# Patient Record
Sex: Male | Born: 1990 | Race: White | Hispanic: No | Marital: Single | Smoking: Former smoker
Health system: Southern US, Community
[De-identification: ages and names within clinical notes are randomized; demographics above are authoritative.]

## PROBLEM LIST (undated history)

## (undated) HISTORY — PX: NO PAST SURGERIES: SHX2092

---

## 2015-03-09 ENCOUNTER — Ambulatory Visit
Admission: EM | Admit: 2015-03-09 | Discharge: 2015-03-09 | Disposition: A | Discharge status: Home / Self Care | Payer: Self-pay | Attending: Family Medicine | Admitting: Family Medicine

## 2015-03-09 ENCOUNTER — Encounter: Payer: Self-pay | Admitting: Emergency Medicine

## 2015-03-09 DIAGNOSIS — N342 Other urethritis: Secondary | ICD-10-CM | POA: Insufficient documentation

## 2015-03-09 LAB — URINALYSIS COMPLETE WITH MICROSCOPIC (ARMC ONLY)
BILIRUBIN URINE: NEGATIVE
Bacteria, UA: NONE SEEN — AB
Glucose, UA: NEGATIVE mg/dL
Ketones, ur: NEGATIVE mg/dL
NITRITE: NEGATIVE
SPECIFIC GRAVITY, URINE: 1.025 (ref 1.005–1.030)
Squamous Epithelial / LPF: NONE SEEN — AB
pH: 6 (ref 5.0–8.0)

## 2015-03-09 MED ORDER — CEFTRIAXONE SODIUM 250 MG IJ SOLR
250.0000 mg | Freq: Once | INTRAMUSCULAR | Status: AC
  Administered 2015-03-09: 250 mg via INTRAMUSCULAR

## 2015-03-09 MED ORDER — AZITHROMYCIN 500 MG PO TABS
1000.0000 mg | ORAL_TABLET | Freq: Once | ORAL | Status: AC
  Administered 2015-03-09: 1000 mg via ORAL

## 2015-03-09 NOTE — ED Provider Notes (Signed)
[facility] Emergency Department Provider Note  ____________________________________________  Time seen: Approximately 10:21 AM  I have reviewed the triage vital signs and the nursing notes.   HISTORY  Chief Complaint Dysuria and Penile Discharge    HPI Jimmy Hale is a 24 y.o. male presents with a one-week history of burning upon urination and penile discharge. States sexually active with unprotected intercourse. Also states that is having pain within the penis itself but denies any testicular pain.   History reviewed. No pertinent past medical history.  There are no active problems to display for this patient.   History reviewed. No pertinent past surgical history.  No current outpatient prescriptions on file.  Allergies Review of patient's allergies indicates no known allergies.  History reviewed. No pertinent family history.  Social History History  Substance Use Topics  . Smoking status: Never Smoker   . Smokeless tobacco: Never Used  . Alcohol Use: Yes    Review of Systems Constitutional: No fever/chills Eyes: No visual changes. ENT: No sore throat. Cardiovascular: Denies chest pain. Respiratory: Denies shortness of breath. Gastrointestinal: No abdominal pain.  No nausea, no vomiting.  No diarrhea.  No constipation. Genitourinary: Positive for dysuria and off whitish colored discharge. Musculoskeletal: Negative for back pain. Skin: Negative for rash. Neurological: Negative for headaches, focal weakness or numbness.  10-point ROS otherwise negative.  ____________________________________________   PHYSICAL EXAM:  VITAL SIGNS: ED Triage Vitals  Enc Vitals Group     BP 03/09/15 1003 117/73 mmHg     Pulse Rate 03/09/15 1003 77     Resp 03/09/15 1003 16     Temp 03/09/15 1003 97.5 F (36.4 C)     Temp Source 03/09/15 1003 Oral     SpO2 03/09/15 1003 98 %     Weight 03/09/15 1003 190 lb (86.183 kg)     Height 03/09/15  1003  (1.905 m)     Head Cir --      Peak Flow --      Pain Score 03/09/15 1005 4     Pain Loc --      Pain Edu? --      Excl. in GC? --     Constitutional: Alert and oriented. Well appearing and in no acute distress. Eyes: Conjunctivae are normal. PERRL. EOMI. Head: Atraumatic. Nose: No congestion/rhinnorhea. Mouth/Throat: Mucous membranes are moist.  Oropharynx non-erythematous. Neck: No stridor.   Cardiovascular: Normal rate, regular rhythm. Grossly normal heart sounds.  Good peripheral circulation. Respiratory: Normal respiratory effort.  No retractions. Lungs CTAB. Gastrointestinal: Soft and nontender. No distention. No abdominal bruits. No CVA tenderness. Genitourinary: Whitish discharge noted. No testicular tenderness. Musculoskeletal: No lower extremity tenderness nor edema.  No joint effusions. Neurologic:  Normal speech and language. No gross focal neurologic deficits are appreciated. Speech is normal. No gait instability. Skin:  Skin is warm, dry and intact. No rash noted. Psychiatric: Mood and affect are normal. Speech and behavior are normal.  ____________________________________________   LABS (all labs ordered are listed, but only abnormal results are displayed)  Labs Reviewed  URINALYSIS COMPLETEWITH MICROSCOPIC ([facility] ONLY) - Abnormal; Notable for the following:    APPearance CLOUDY (*)    Hgb urine dipstick TRACE (*)    Protein, ur TRACE (*)    Leukocytes, UA 3+ (*)    Bacteria, UA NONE SEEN (*)    Squamous Epithelial / LPF NONE SEEN (*)    All other components within normal limits  CHLAMYDIA/NGC RT PCR ([facility] ONLY)  ____________________________________________  EKG  Deferred ____________________________________________  RADIOLOGY  Deferred ____________________________________________   PROCEDURES  Procedure(s) performed: None  Critical Care performed: No  ____________________________________________   INITIAL IMPRESSION /  ASSESSMENT AND PLAN / ED COURSE  Pertinent labs & imaging results that were available during my care of the patient were reviewed by me and considered in my medical decision making (see chart for details).  Treat for acute urethritis rule out gonorrhea and chlamydia. Rx provided in-house for Zithromax 1 g, and Rocephin 250 IM. Patient instructed notify all sexual partners. Await final GC chlamydia results. Discharged to home to return with any worsening symptomology. He voices no other emergency medical complaints at this visit. ____________________________________________   FINAL CLINICAL IMPRESSION(S) / ED DIAGNOSES  Final diagnoses:  Urethritis      Evangeline Dakin, PA-C 03/09/15 1127

## 2015-03-09 NOTE — Discharge Instructions (Signed)
Urethritis °Urethritis is an inflammation of the tube through which urine exits your bladder (urethra).  °CAUSES °Urethritis is often caused by an infection in your urethra. The infection can be viral, like herpes. The infection can also be bacterial, like gonorrhea. °RISK FACTORS °Risk factors of urethritis include: °· Having sex without using a condom. °· Having multiple sexual partners. °· Having poor hygiene. °SIGNS AND SYMPTOMS °Symptoms of urethritis are less noticeable in women than in men. These symptoms include: °· Burning feeling when you urinate (dysuria). °· Discharge from your urethra. °· Blood in your urine (hematuria). °· Urinating more than usual. °DIAGNOSIS  °To confirm a diagnosis of urethritis, your health care provider will do the following: °· Ask about your sexual history. °· Perform a physical exam. °· Have you provide a sample of your urine for lab testing. °· Use a cotton swab to gently collect a sample from your urethra for lab testing. °TREATMENT  °It is important to treat urethritis. Depending on the cause, untreated urethritis may lead to serious genital infections and possibly infertility. Urethritis caused by a bacterial infection is treated with antibiotic medicine. All sexual partners must be treated.  °HOME CARE INSTRUCTIONS °· Do not have sex until the test results are known and treatment is completed, even if your symptoms go away before you finish treatment. °· If you were prescribed an antibiotic, finish it all even if you start to feel better. °SEEK MEDICAL CARE IF:  °· Your symptoms are not improved in 3 days. °· Your symptoms are getting worse. °· You develop abdominal pain or pelvic pain (in women). °· You develop joint pain. °· You have a fever. °SEEK IMMEDIATE MEDICAL CARE IF:  °· You have severe pain in the belly, back, or side. °· You have repeated vomiting. °MAKE SURE YOU: °· Understand these instructions. °· Will watch your condition. °· Will get help right away if you  are not doing well or get worse. °Document Released: 03/12/2001 Document Revised: 01/31/2014 Document Reviewed: 05/17/2013 °[employer]® Patient Information ©2015 [employer]. This information is not intended to replace advice given to you by your health care provider. Make sure you discuss any questions you have with your health care provider. ° °

## 2015-03-09 NOTE — ED Notes (Signed)
Patient c/o burning when urinating and penile discharge for a week.  Patient denies fevers.  Patient denies N/V.

## 2015-03-10 LAB — CHLAMYDIA/NGC RT PCR (ARMC ONLY)
Chlamydia Tr: NOT DETECTED
N GONORRHOEAE: DETECTED

## 2020-08-21 ENCOUNTER — Other Ambulatory Visit: Payer: Self-pay

## 2020-08-21 ENCOUNTER — Encounter: Payer: Self-pay | Admitting: Emergency Medicine

## 2020-08-21 ENCOUNTER — Ambulatory Visit
Admission: EM | Admit: 2020-08-21 | Discharge: 2020-08-21 | Disposition: A | Discharge status: Home / Self Care | Payer: Self-pay | Attending: Physician Assistant | Admitting: Physician Assistant

## 2020-08-21 ENCOUNTER — Ambulatory Visit (INDEPENDENT_AMBULATORY_CARE_PROVIDER_SITE_OTHER): Payer: Self-pay

## 2020-08-21 DIAGNOSIS — R059 Cough, unspecified: Secondary | ICD-10-CM

## 2020-08-21 DIAGNOSIS — R0602 Shortness of breath: Secondary | ICD-10-CM

## 2020-08-21 DIAGNOSIS — J209 Acute bronchitis, unspecified: Secondary | ICD-10-CM

## 2020-08-21 MED ORDER — ALBUTEROL SULFATE HFA 108 (90 BASE) MCG/ACT IN AERS: 1.0000 | INHALATION_SPRAY | RESPIRATORY_TRACT | 0 refills | Status: AC | PRN

## 2020-08-21 MED ORDER — PREDNISONE 20 MG PO TABS: 40.0000 mg | ORAL_TABLET | Freq: Every day | ORAL | 0 refills | Status: AC

## 2020-08-21 MED ORDER — AZITHROMYCIN 250 MG PO TABS: 250.0000 mg | ORAL_TABLET | Freq: Every day | ORAL | 0 refills | Status: DC

## 2020-08-21 NOTE — ED Provider Notes (Signed)
[facility]    CSN: [id number] Arrival date & time: 08/21/20  1515      History   Chief Complaint Chief Complaint  Patient presents with  . Cough    HPI Jimmy Hale is a 29 y.o. male presenting for 3 week history of chest congestion and  pressure, productive cough, shortness of breath, and a few episodes of vomiting.  Patient states that he has noticed streaks of blood in his yellow-colored sputum over the last 2 days.  Patient states that he does dip and vape.  He says he is concerned about the blood in his sputum.  Patient denies any associated fever, fatigue, weakness, abdominal pain.  He is otherwise healthy without any chronic medical problems.  Denies any history of cardiopulmonary disease.  No known exposure to Covid.  Patient not vaccinated for Covid.  Denies any history of bleeding or clotting disorders.  No history of DVT or PE.  He has not taken any over-the-counter medication for symptoms.  No other complaints or concerns.  HPI  History reviewed. No pertinent past medical history.  There are no problems to display for this patient.   Past Surgical History:  Procedure Laterality Date  . NO PAST SURGERIES         Home Medications    Prior to Admission medications   Medication Sig Start Date End Date Taking? Authorizing Provider  albuterol (VENTOLIN HFA) 108 (90 Base) MCG/ACT inhaler Inhale 1-2 puffs into the lungs every 4 (four) hours as needed for up to 10 days for wheezing or shortness of breath. 08/21/20 08/31/20  Eusebio Friendly B, PA-C  azithromycin (ZITHROMAX) 250 MG tablet Take 1 tablet (250 mg total) by mouth daily. Take first 2 tablets together, then 1 every day until finished. 08/21/20   Shirlee Latch, PA-C  predniSONE (DELTASONE) 20 MG tablet Take 2 tablets (40 mg total) by mouth daily for 5 days. 08/21/20 08/26/20  Shirlee Latch, PA-C    Family History Family History  Problem Relation Age of Onset  . Healthy Mother   . Healthy  Father     Social History Social History   Tobacco Use  . Smoking status: Never Smoker  . Smokeless tobacco: Current User    Types: Snuff  Vaping Use  . Vaping Use: Some days  Substance Use Topics  . Alcohol use: Yes  . Drug use: No     Allergies   Patient has no known allergies.   Review of Systems Review of Systems  Constitutional: Negative for fatigue and fever.  HENT: Positive for congestion. Negative for rhinorrhea, sinus pressure, sinus pain and sore throat.   Respiratory: Positive for cough and shortness of breath. Negative for wheezing.   Cardiovascular: Positive for chest pain.  Gastrointestinal: Negative for abdominal pain, diarrhea, nausea and vomiting.  Musculoskeletal: Negative for myalgias.  Neurological: Negative for weakness, light-headedness and headaches.  Hematological: Negative for adenopathy.     Physical Exam Triage Vital Signs ED Triage Vitals  Enc Vitals Group     BP 08/21/20 1609 119/83     Pulse Rate 08/21/20 1609 79     Resp 08/21/20 1609 16     Temp 08/21/20 1609 98.1 F (36.7 C)     Temp Source 08/21/20 1609 Oral     SpO2 08/21/20 1609 100 %     Weight 08/21/20 1607 190 lb 0.6 oz (86.2 kg)     Height 08/21/20 1607 6\' 3"  (1.905 m)  Head Circumference --      Peak Flow --      Pain Score 08/21/20 1607 5     Pain Loc --      Pain Edu? --      Excl. in GC? --    No data found.  Updated Vital Signs BP 119/83 (BP Location: Right Arm)   Pulse 79   Temp 98.1 F (36.7 C) (Oral)   Resp 16   Ht 6\' 3"  (1.905 m)   Wt 190 lb 0.6 oz (86.2 kg)   SpO2 100%   BMI 23.75 kg/m       Physical Exam Vitals and nursing note reviewed.  Constitutional:      General: He is not in acute distress.    Appearance: Normal appearance. He is well-developed. He is not ill-appearing, toxic-appearing or diaphoretic.  HENT:     Head: Normocephalic and atraumatic.     Right Ear: Tympanic membrane, ear canal and external ear normal.     Left Ear:  Tympanic membrane, ear canal and external ear normal.     Nose: Rhinorrhea present.     Mouth/Throat:     Pharynx: Uvula midline. Posterior oropharyngeal erythema (with clear PND) present. No oropharyngeal exudate.     Tonsils: No tonsillar abscesses.  Eyes:     General: No scleral icterus.       Right eye: No discharge.        Left eye: No discharge.     Conjunctiva/sclera: Conjunctivae normal.     Pupils: Pupils are equal, round, and reactive to light.  Neck:     Thyroid: No thyromegaly.     Trachea: No tracheal deviation.  Cardiovascular:     Rate and Rhythm: Normal rate and regular rhythm.     Heart sounds: Normal heart sounds.  Pulmonary:     Effort: Pulmonary effort is normal. No respiratory distress.     Breath sounds: No stridor. Rhonchi (few scattered rhonchi throughout which clear with cough) present. No wheezing or rales.  Chest:     Chest wall: No tenderness.  Musculoskeletal:     Cervical back: Normal range of motion and neck supple.  Skin:    General: Skin is warm and dry.     Findings: No rash.  Neurological:     General: No focal deficit present.     Mental Status: He is alert. Mental status is at baseline.     Motor: No weakness.     Gait: Gait normal.  Psychiatric:        Mood and Affect: Mood normal.        Behavior: Behavior normal.        Thought Content: Thought content normal.      UC Treatments / Results  Labs (all labs ordered are listed, but only abnormal results are displayed) Labs Reviewed - No data to display  EKG   Radiology DG Chest 2 View  Result Date: 08/21/2020 CLINICAL DATA:  Cough, shortness of breath. EXAM: CHEST - 2 VIEW COMPARISON:  None. FINDINGS: The heart size and mediastinal contours are within normal limits. Both lungs are clear. No pneumothorax or pleural effusion is noted. The visualized skeletal structures are unremarkable. IMPRESSION: No active cardiopulmonary disease. Electronically Signed   By: 08/23/2020 M.D.    On: 08/21/2020 16:24    Procedures Procedures (including critical care time)  Medications Ordered in UC Medications - No data to display  Initial Impression / Assessment and Plan /  UC Course  I have reviewed the triage vital signs and the nursing notes.  Pertinent labs & imaging results that were available during my care of the patient were reviewed by me and considered in my medical decision making (see chart for details).   29 year old male presenting for 3-week history of productive cough, chest pressure and pain difficulty.  New onset streaks of blood in the sputum.  All vital signs are within normal limits and stable.  Patient afebrile.  Chest x-ray obtained today due to symptoms.  Chest x-ray is within normal limits.  I reviewed the x-ray personally.  Suspect bronchitis.  Advised this is likely viral in origin, but sometimes are bacterial causes so treating with azithromycin.  Also sent prednisone which I believe should help with the chest discomfort and inflammation.  Sent albuterol inhalers to use as needed for any shortness of breath.  He states that he gets little short of breath whenever he climbs ladder at work.  Advised to try the inhaler at that time.  Advised him to take over-the-counter Mucinex DM increase fluid intake.  ED precautions discussed especially if he develops any fever, increased breathing difficulty, worsening chest pain, increased fatigue or worsening cough.  Patient agreeable.   Final Clinical Impressions(s) / UC Diagnoses   Final diagnoses:  Acute bronchitis, unspecified organism  Cough  Shortness of breath     Discharge Instructions     Your chest x-ray was clear today.  Your symptoms are consistent with bronchitis or chest cold.  Usually this is viral but sometimes there can be bacterial causes.  Begin azithromycin to treat potential bacterial causes.  Increase rest and fluid intake.  Take over-the-counter Mucinex D.  Use the albuterol inhaler as  needed for any shortness of breath.  Prednisone should help with chest inflammation due to the bronchitis.  You should follow-up for any new or worsening symptoms or if you are not getting better over the next couple of weeks.  If you have increased blood in the sputum, worsening chest pain, worsening breathing difficulty or worsening cough call EMS or go to emergency department for work-up.    ED Prescriptions    Medication Sig Dispense Auth. Provider   predniSONE (DELTASONE) 20 MG tablet Take 2 tablets (40 mg total) by mouth daily for 5 days. 10 tablet Eusebio Friendly B, PA-C   albuterol (VENTOLIN HFA) 108 (90 Base) MCG/ACT inhaler Inhale 1-2 puffs into the lungs every 4 (four) hours as needed for up to 10 days for wheezing or shortness of breath. 1 g Eusebio Friendly B, PA-C   azithromycin (ZITHROMAX) 250 MG tablet Take 1 tablet (250 mg total) by mouth daily. Take first 2 tablets together, then 1 every day until finished. 6 tablet Gareth Morgan     PDMP not reviewed this encounter.   Shirlee Latch, PA-C 08/21/20 1654

## 2020-08-21 NOTE — Discharge Instructions (Signed)
Your chest x-ray was clear today.  Your symptoms are consistent with bronchitis or chest cold.  Usually this is viral but sometimes there can be bacterial causes.  Begin azithromycin to treat potential bacterial causes.  Increase rest and fluid intake.  Take over-the-counter Mucinex D.  Use the albuterol inhaler as needed for any shortness of breath.  Prednisone should help with chest inflammation due to the bronchitis.  You should follow-up for any new or worsening symptoms or if you are not getting better over the next couple of weeks.  If you have increased blood in the sputum, worsening chest pain, worsening breathing difficulty or worsening cough call EMS or go to emergency department for work-up.

## 2020-08-21 NOTE — ED Triage Notes (Signed)
Pt c/o chest congestion, cough, shortness of breath, and vomiting. Started about 3 weeks ago. He also coughed up blood last night.

## 2022-03-12 IMAGING — CR DG CHEST 2V
2 series · 2 of 2 positions shown · non-contrast
Comparison: None.

CLINICAL DATA: Cough, shortness of breath.

EXAM:
CHEST - 2 VIEW

[chest pa]
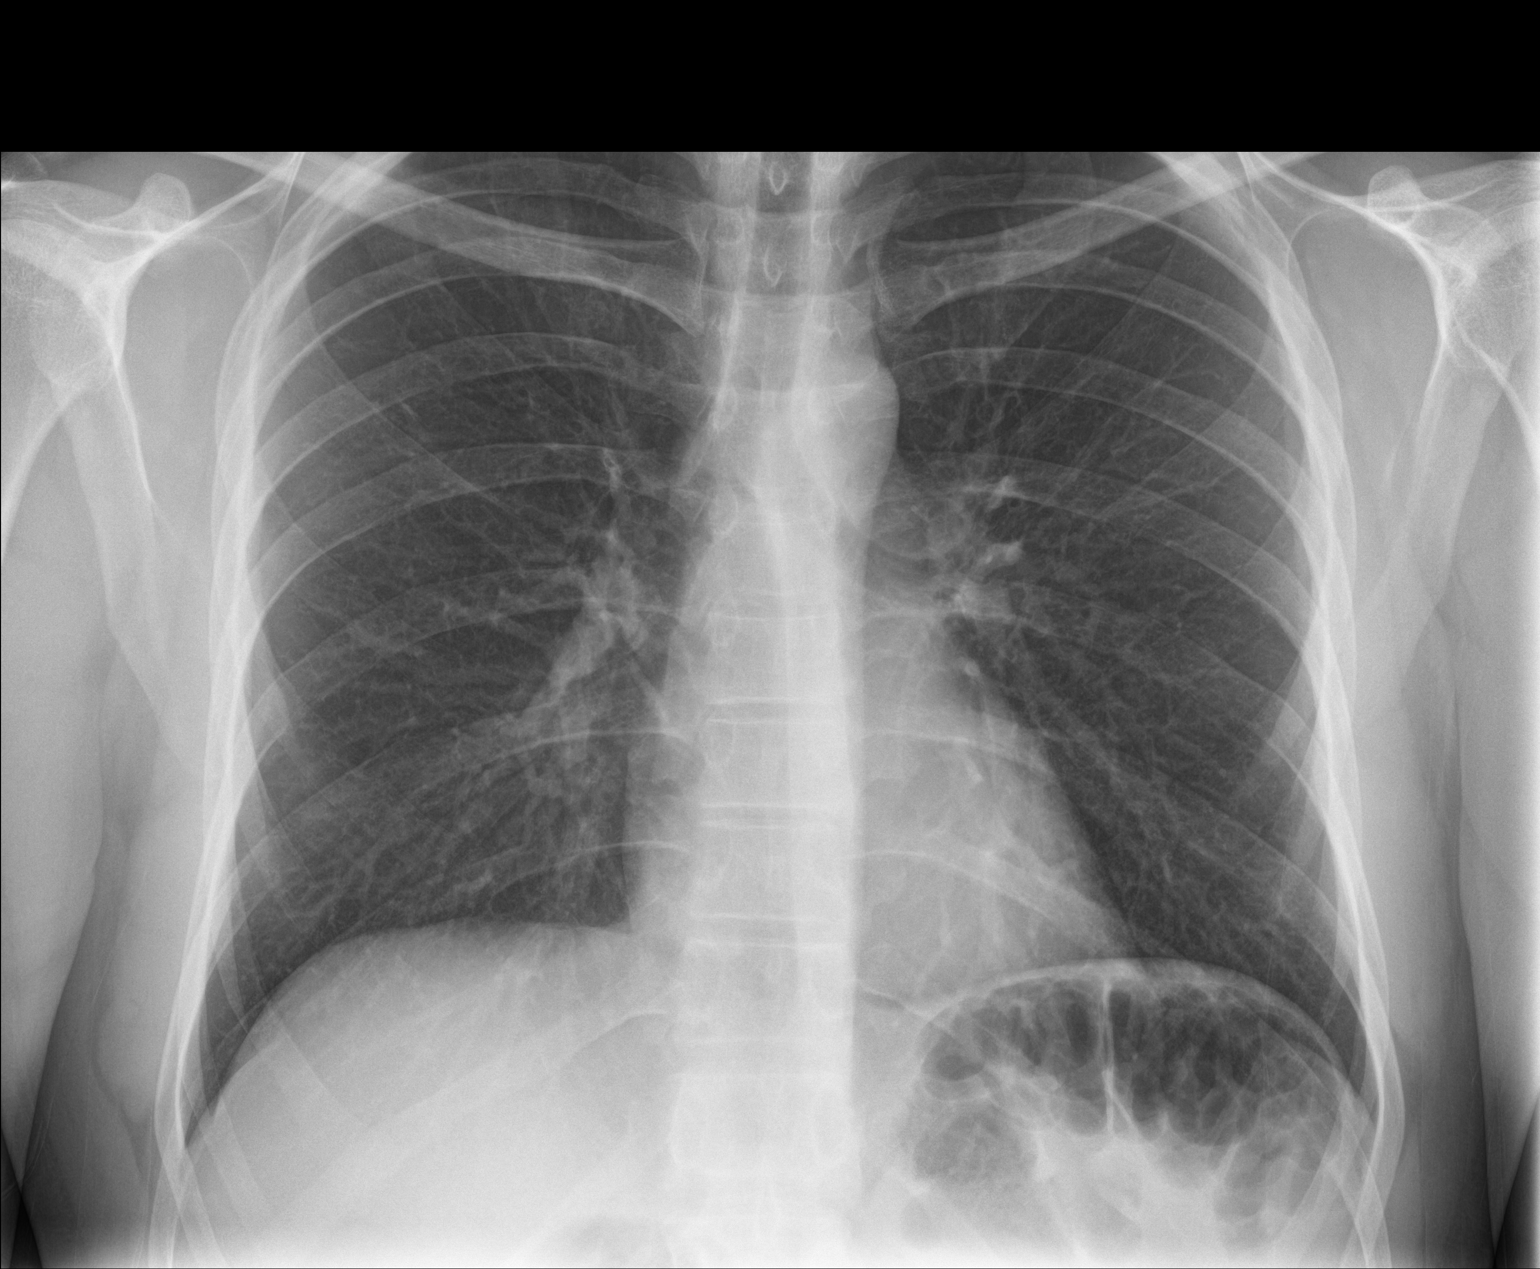

[chest lat]
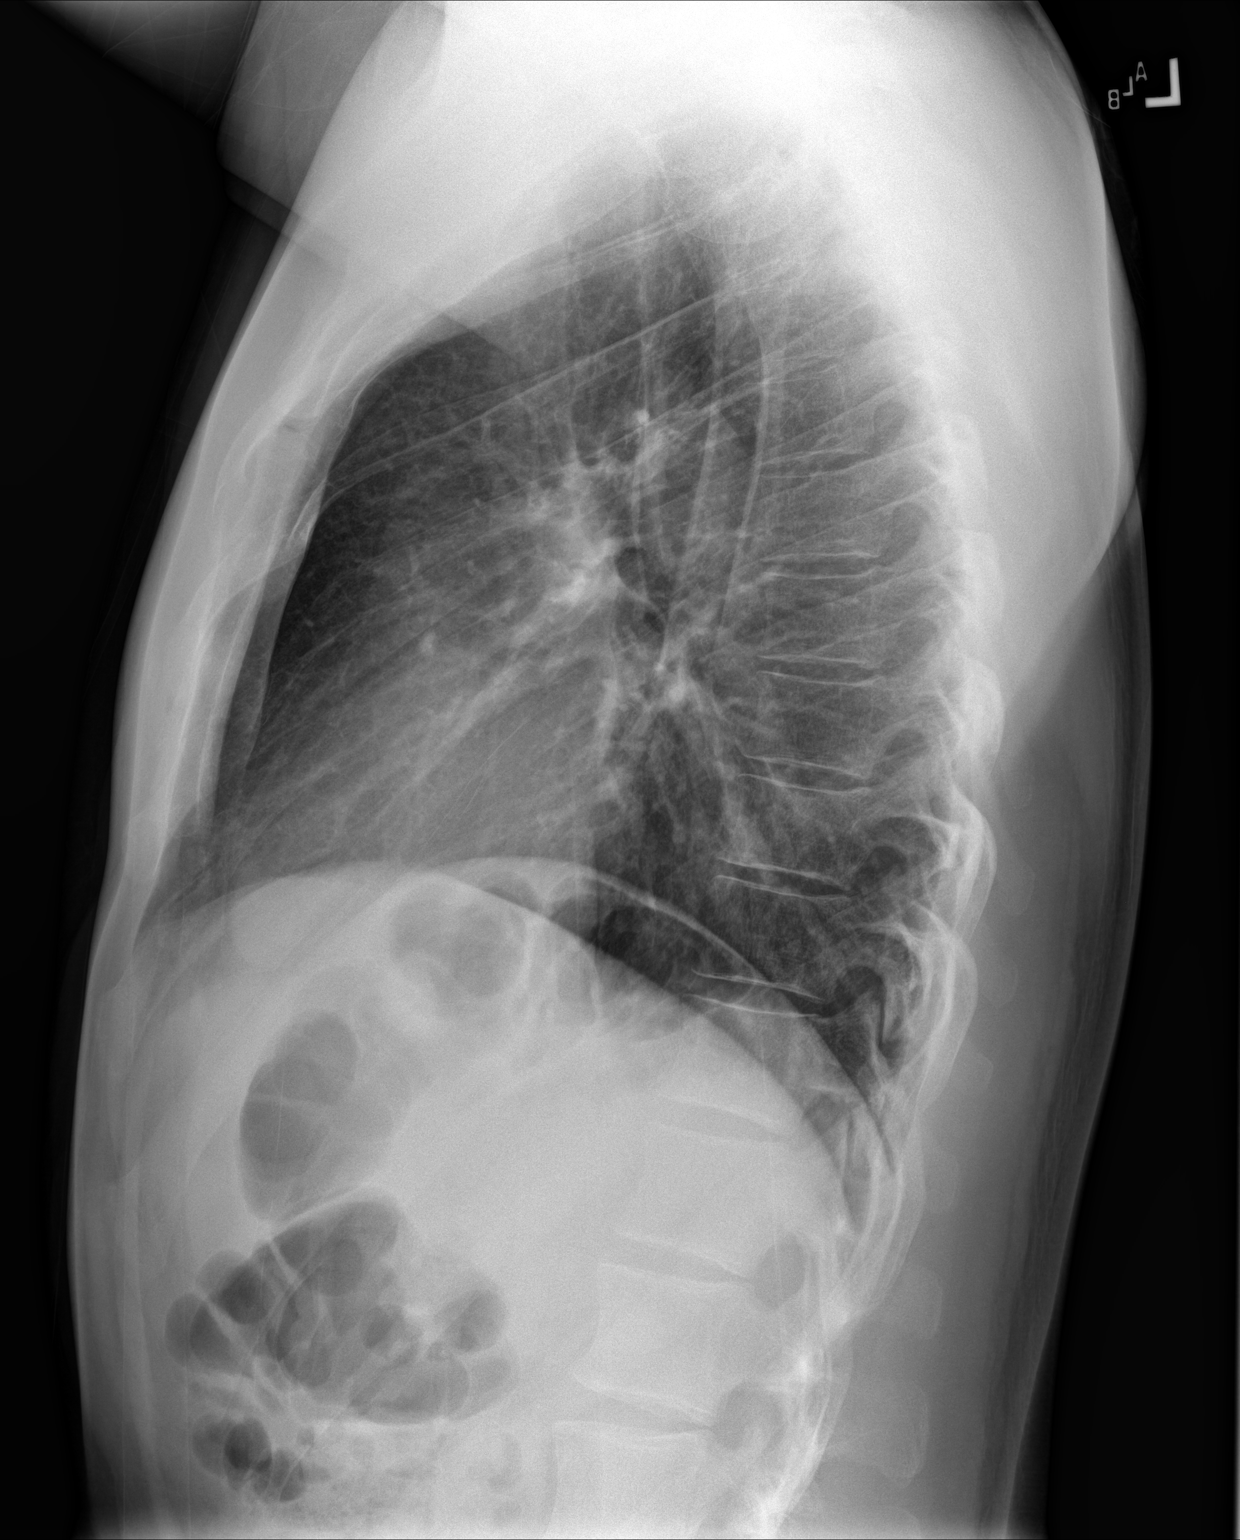

[2 of 2 positions shown; findings below may reference images not displayed]

FINDINGS: The heart size and mediastinal contours are within normal limits.
Both lungs are clear. No pneumothorax or pleural effusion is noted.
The visualized skeletal structures are unremarkable.
IMPRESSION: No active cardiopulmonary disease.

## 2022-05-02 ENCOUNTER — Emergency Department: Payer: Self-pay

## 2022-05-02 ENCOUNTER — Other Ambulatory Visit: Payer: Self-pay

## 2022-05-02 ENCOUNTER — Ambulatory Visit: Admission: EM | Admit: 2022-05-02 | Discharge: 2022-05-02 | Payer: Self-pay

## 2022-05-02 ENCOUNTER — Encounter: Payer: Self-pay | Admitting: Emergency Medicine

## 2022-05-02 ENCOUNTER — Emergency Department
Admission: EM | Admit: 2022-05-02 | Discharge: 2022-05-02 | Disposition: A | Discharge status: Home / Self Care | Payer: Self-pay | Attending: Emergency Medicine | Admitting: Emergency Medicine

## 2022-05-02 DIAGNOSIS — R42 Dizziness and giddiness: Secondary | ICD-10-CM | POA: Insufficient documentation

## 2022-05-02 DIAGNOSIS — R1031 Right lower quadrant pain: Secondary | ICD-10-CM | POA: Insufficient documentation

## 2022-05-02 DIAGNOSIS — G44311 Acute post-traumatic headache, intractable: Secondary | ICD-10-CM

## 2022-05-02 DIAGNOSIS — M542 Cervicalgia: Secondary | ICD-10-CM | POA: Insufficient documentation

## 2022-05-02 DIAGNOSIS — S060XAA Concussion with loss of consciousness status unknown, initial encounter: Secondary | ICD-10-CM | POA: Insufficient documentation

## 2022-05-02 DIAGNOSIS — W19XXXA Unspecified fall, initial encounter: Secondary | ICD-10-CM | POA: Insufficient documentation

## 2022-05-02 DIAGNOSIS — S060X9A Concussion with loss of consciousness of unspecified duration, initial encounter: Secondary | ICD-10-CM

## 2022-05-02 DIAGNOSIS — R159 Full incontinence of feces: Secondary | ICD-10-CM

## 2022-05-02 DIAGNOSIS — Z20822 Contact with and (suspected) exposure to covid-19: Secondary | ICD-10-CM | POA: Insufficient documentation

## 2022-05-02 DIAGNOSIS — S069X1A Unspecified intracranial injury with loss of consciousness of 30 minutes or less, initial encounter: Secondary | ICD-10-CM

## 2022-05-02 LAB — CBC WITH DIFFERENTIAL/PLATELET
Abs Immature Granulocytes: 0.02 10*3/uL (ref 0.00–0.07)
Basophils Absolute: 0 10*3/uL (ref 0.0–0.1)
Basophils Relative: 0 %
Eosinophils Absolute: 0.1 10*3/uL (ref 0.0–0.5)
Eosinophils Relative: 1 %
HCT: 45 % (ref 39.0–52.0)
Hemoglobin: 15.3 g/dL (ref 13.0–17.0)
Immature Granulocytes: 0 %
Lymphocytes Relative: 28 %
Lymphs Abs: 1.4 10*3/uL (ref 0.7–4.0)
MCH: 30.2 pg (ref 26.0–34.0)
MCHC: 34 g/dL (ref 30.0–36.0)
MCV: 88.8 fL (ref 80.0–100.0)
Monocytes Absolute: 0.7 10*3/uL (ref 0.1–1.0)
Monocytes Relative: 14 %
Neutro Abs: 2.7 10*3/uL (ref 1.7–7.7)
Neutrophils Relative %: 57 %
Platelets: 176 10*3/uL (ref 150–400)
RBC: 5.07 MIL/uL (ref 4.22–5.81)
RDW: 12.1 % (ref 11.5–15.5)
WBC: 4.9 10*3/uL (ref 4.0–10.5)
nRBC: 0 % (ref 0.0–0.2)

## 2022-05-02 LAB — COMPREHENSIVE METABOLIC PANEL
ALT: 18 U/L (ref 0–44)
AST: 17 U/L (ref 15–41)
Albumin: 4.5 g/dL (ref 3.5–5.0)
Alkaline Phosphatase: 56 U/L (ref 38–126)
Anion gap: 8 (ref 5–15)
BUN: 10 mg/dL (ref 6–20)
CO2: 24 mmol/L (ref 22–32)
Calcium: 9 mg/dL (ref 8.9–10.3)
Chloride: 107 mmol/L (ref 98–111)
Creatinine, Ser: 1.32 mg/dL — ABNORMAL HIGH (ref 0.61–1.24)
GFR, Estimated: >60 mL/min (ref >60)
Glucose, Bld: 91 mg/dL (ref 70–99)
Potassium: 4.1 mmol/L (ref 3.5–5.1)
Sodium: 139 mmol/L (ref 135–145)
Total Bilirubin: 0.7 mg/dL (ref 0.3–1.2)
Total Protein: 7.1 g/dL (ref 6.5–8.1)

## 2022-05-02 LAB — RESP PANEL BY RT-PCR (FLU A&B, COVID) ARPGX2
Influenza A by PCR: NEGATIVE
Influenza B by PCR: NEGATIVE
SARS Coronavirus 2 by RT PCR: NEGATIVE

## 2022-05-02 LAB — LIPASE, BLOOD: Lipase: 36 U/L (ref 11–51)

## 2022-05-02 MED ORDER — IOHEXOL 300 MG/ML  SOLN
100.0000 mL | Freq: Once | INTRAMUSCULAR | Status: AC | PRN
  Administered 2022-05-02: 100 mL via INTRAVENOUS

## 2022-05-02 MED ORDER — FAMOTIDINE 20 MG PO TABS: 20.0000 mg | ORAL_TABLET | Freq: Two times a day (BID) | ORAL | 0 refills | Status: AC

## 2022-05-02 MED ORDER — ONDANSETRON HCL 4 MG/2ML IJ SOLN
4.0000 mg | Freq: Once | INTRAMUSCULAR | Status: AC
  Administered 2022-05-02: 4 mg via INTRAVENOUS
  Filled 2022-05-02: qty 2

## 2022-05-02 MED ORDER — SODIUM CHLORIDE 0.9 % IV BOLUS
1000.0000 mL | Freq: Once | INTRAVENOUS | Status: AC
  Administered 2022-05-02: 1000 mL via INTRAVENOUS

## 2022-05-02 MED ORDER — NAPROXEN 500 MG PO TABS: 500.0000 mg | ORAL_TABLET | Freq: Two times a day (BID) | ORAL | 0 refills | Status: DC

## 2022-05-02 MED ORDER — KETOROLAC TROMETHAMINE 15 MG/ML IJ SOLN
15.0000 mg | Freq: Once | INTRAMUSCULAR | Status: AC
  Administered 2022-05-02: 15 mg via INTRAVENOUS
  Filled 2022-05-02: qty 1

## 2022-05-02 MED ORDER — LOPERAMIDE HCL 2 MG PO TABS: 4.0000 mg | ORAL_TABLET | Freq: Four times a day (QID) | ORAL | 0 refills | Status: AC | PRN

## 2022-05-02 MED ORDER — ONDANSETRON 4 MG PO TBDP: 4.0000 mg | ORAL_TABLET | Freq: Three times a day (TID) | ORAL | 0 refills | Status: DC | PRN

## 2022-05-02 NOTE — Discharge Instructions (Addendum)
Your CT scans of the head neck and abdomen were all okay today.  Your lab tests were all normal.  You can use Zofran for nausea and Imodium for loose bowel movements.  Return to the ER for repeat assessment if you have worsening pain, fever, inability to eat or drink, or other new concerns.

## 2022-05-02 NOTE — Consult Note (Signed)
Date of Consultation:  05/02/2022  Requesting Physician:  Sharman Cheek, MD  Reason for Consultation:  Abdominal pain  History of Present Illness: Jimmy Hale is a 31 y.o. male presenting for evaluation of multiple symptoms.  The patient suffered a fall where he hit his head about a week ago, and had loss of consciousness.  Since he's had issues with dizziness, blurry vision, some weakness, nausea, and some discomfort in the right abdomen.  Then about 2 days ago he started having some worse pain in the low abdomen towards the right lower quadrant, and also started having incontinence of stool.  He reports having diarrhea without any warning and inability to hold his stool.  Denies any troubles voiding or numbness in the pelvis.  Denies any sick contacts and denies any bad foods, but he also has not been as hungry as usual.  Given the progressing number of symptoms, he presented to Urgent Care and then to the ER at their recommendation given the neurologic symptoms.   In the ED, his laboratory workup showed a normal WBC of 4.9, mildly elevated creatinine of 1.32, otherwise normal electrolytes and CBC.  He had a CT scan of head and cervical spine which did not show any acute abnormalities, and also a CT scan of abdomen and pelvis which only showed a thickened appendix to 8 mm, but without any periappendiceal stranding or fluid.  He is afebrile, with normal blood pressure and heart rate.  He used to be in [employer]	[place] and is overall very healthy.  He currently works as a Optometrist.  Past Medical History: History reviewed. No pertinent past medical history.   Past Surgical History: Past Surgical History:  Procedure Laterality Date   NO PAST SURGERIES      Home Medications: Prior to Admission medications   Medication Sig Start Date End Date Taking? Authorizing Provider  albuterol (VENTOLIN HFA) 108 (90 Base) MCG/ACT inhaler Inhale 1-2 puffs into the lungs every 4 (four) hours as needed for up  to 10 days for wheezing or shortness of breath. 08/21/20 08/31/20  Shirlee Latch, PA-C    Allergies: No Known Allergies  Social History:  reports that he has been smoking cigarettes. His smokeless tobacco use includes snuff. He reports current alcohol use. He reports that he does not use drugs.   Family History: Family History  Problem Relation Age of Onset   Healthy Mother    Healthy Father     Review of Systems: Review of Systems  Constitutional:  Positive for malaise/fatigue. Negative for chills and fever.  HENT:  Negative for hearing loss.   Respiratory:  Negative for shortness of breath.   Cardiovascular:  Negative for chest pain.  Gastrointestinal:  Positive for abdominal pain, diarrhea and nausea.  Genitourinary:  Negative for dysuria.  Musculoskeletal:  Negative for myalgias.  Skin:  Negative for rash.  Neurological:  Positive for dizziness, weakness and headaches.    Physical Exam BP 114/84 (BP Location: Left Arm)   Pulse 79   Temp 98.8 F (37.1 C) (Oral)   Resp 16   Ht 6\' 3"  (1.905 m)   Wt 86.2 kg   SpO2 97%   BMI 23.75 kg/m  CONSTITUTIONAL: No acute distress, well nourished. HEENT:  Normocephalic, atraumatic, extraocular motion intact. NECK: Trachea is midline, and there is no jugular venous distension. RESPIRATORY:  Normal respiratory effort without pathologic use of accessory muscles. CARDIOVASCULAR: Regular rhythm and rate. GI: The abdomen is soft, non-distended, with localized tenderness in the  lower pelvis toward the right lower quadrant.  No diffuse peritonitis.  MUSCULOSKELETAL:  No peripheral edema or cyanosis. SKIN: Skin turgor is normal. There are no pathologic skin lesions.  NEUROLOGIC:  Motor and sensation is grossly normal.  Cranial nerves are grossly intact. PSYCH:  Alert and oriented to person, place and time. Affect is normal.  Laboratory Analysis: Results for orders placed or performed during the hospital encounter of 05/02/22 (from the  past 24 hour(s))  Resp Panel by RT-PCR (Flu A&B, Covid) Anterior Nasal Swab     Status: None   Collection Time: 05/02/22  2:53 PM   Specimen: Anterior Nasal Swab  Result Value Ref Range   SARS Coronavirus 2 by RT PCR NEGATIVE NEGATIVE   Influenza A by PCR NEGATIVE NEGATIVE   Influenza B by PCR NEGATIVE NEGATIVE  CBC with Differential     Status: None   Collection Time: 05/02/22  2:53 PM  Result Value Ref Range   WBC 4.9 4.0 - 10.5 K/uL   RBC 5.07 4.22 - 5.81 MIL/uL   Hemoglobin 15.3 13.0 - 17.0 g/dL   HCT 32.9 92.4 - 26.8 %   MCV 88.8 80.0 - 100.0 fL   MCH 30.2 26.0 - 34.0 pg   MCHC 34.0 30.0 - 36.0 g/dL   RDW 34.1 96.2 - 22.9 %   Platelets 176 150 - 400 K/uL   nRBC 0.0 0.0 - 0.2 %   Neutrophils Relative % 57 %   Neutro Abs 2.7 1.7 - 7.7 K/uL   Lymphocytes Relative 28 %   Lymphs Abs 1.4 0.7 - 4.0 K/uL   Monocytes Relative 14 %   Monocytes Absolute 0.7 0.1 - 1.0 K/uL   Eosinophils Relative 1 %   Eosinophils Absolute 0.1 0.0 - 0.5 K/uL   Basophils Relative 0 %   Basophils Absolute 0.0 0.0 - 0.1 K/uL   Immature Granulocytes 0 %   Abs Immature Granulocytes 0.02 0.00 - 0.07 K/uL  Comprehensive metabolic panel     Status: Abnormal   Collection Time: 05/02/22  2:53 PM  Result Value Ref Range   Sodium 139 135 - 145 mmol/L   Potassium 4.1 3.5 - 5.1 mmol/L   Chloride 107 98 - 111 mmol/L   CO2 24 22 - 32 mmol/L   Glucose, Bld 91 70 - 99 mg/dL   BUN 10 6 - 20 mg/dL   Creatinine, Ser 7.98 (H) 0.61 - 1.24 mg/dL   Calcium 9.0 8.9 - 92.1 mg/dL   Total Protein 7.1 6.5 - 8.1 g/dL   Albumin 4.5 3.5 - 5.0 g/dL   AST 17 15 - 41 U/L   ALT 18 0 - 44 U/L   Alkaline Phosphatase 56 38 - 126 U/L   Total Bilirubin 0.7 0.3 - 1.2 mg/dL   GFR, Estimated >19 >41 mL/min   Anion gap 8 5 - 15  Lipase, blood     Status: None   Collection Time: 05/02/22  2:53 PM  Result Value Ref Range   Lipase 36 11 - 51 U/L    Imaging: CT ABDOMEN PELVIS W CONTRAST  Result Date: 05/02/2022 CLINICAL DATA:   Right lower quadrant pain. EXAM: CT ABDOMEN AND PELVIS WITH CONTRAST TECHNIQUE: Multidetector CT imaging of the abdomen and pelvis was performed using the standard protocol following bolus administration of intravenous contrast. RADIATION DOSE REDUCTION: This exam was performed according to the departmental dose-optimization program which includes automated exposure control, adjustment of the mA and/or kV according to patient size and/or  use of iterative reconstruction technique. CONTRAST:  OMNIPAQUE IOHEXOL 300 MG/ML  SOLN COMPARISON:  None Available. FINDINGS: Lower chest: Unremarkable. Hepatobiliary: No suspicious focal abnormality within the liver parenchyma. There is no evidence for gallstones, gallbladder wall thickening, or pericholecystic fluid. No intrahepatic or extrahepatic biliary dilation. Pancreas: No focal mass lesion. No dilatation of the main duct. No intraparenchymal cyst. No peripancreatic edema. Spleen: No splenomegaly. No focal mass lesion. Adrenals/Urinary Tract: No adrenal nodule or mass. Kidneys unremarkable. No evidence for hydroureter. The urinary bladder appears normal for the degree of distention. Stomach/Bowel: Stomach is unremarkable. No gastric wall thickening. No evidence of outlet obstruction. Duodenum is normally positioned as is the ligament of Treitz. No small bowel wall thickening. No small bowel dilatation. The terminal ileum is normal. Appendix measures 8 mm diameter. Although there is no substantial periappendiceal edema or inflammation, the wall of the appendix appears mildly thickened and hyperemic. No gross colonic mass. No colonic wall thickening. Vascular/Lymphatic: No abdominal aortic aneurysm. There is no gastrohepatic or hepatoduodenal ligament lymphadenopathy. No retroperitoneal or mesenteric lymphadenopathy. No pelvic sidewall lymphadenopathy. Reproductive: The prostate gland and seminal vesicles are unremarkable. Other: No intraperitoneal free fluid.  Musculoskeletal: No worrisome lytic or sclerotic osseous abnormality. IMPRESSION: 1. The appendix measures 8 mm diameter. Although there is no substantial periappendiceal edema or inflammation, the wall of the appendix appears mildly thickened and hyperemic. Close correlation for signs/symptoms of acute appendicitis recommended. 2. Otherwise unremarkable exam. Electronically Signed   By: Kennith Center M.D.   On: 05/02/2022 15:44   CT Head Wo Contrast  Result Date: 05/02/2022 CLINICAL DATA:  Fall with head injury 1 week ago. Dizziness and headache. Neck pain. EXAM: CT HEAD WITHOUT CONTRAST CT CERVICAL SPINE WITHOUT CONTRAST TECHNIQUE: Multidetector CT imaging of the head and cervical spine was performed following the standard protocol without intravenous contrast. Multiplanar CT image reconstructions of the cervical spine were also generated. RADIATION DOSE REDUCTION: This exam was performed according to the departmental dose-optimization program which includes automated exposure control, adjustment of the mA and/or kV according to patient size and/or use of iterative reconstruction technique. COMPARISON:  None Available. FINDINGS: CT HEAD FINDINGS Brain: No evidence of acute infarction, hemorrhage, hydrocephalus, extra-axial collection or mass lesion/mass effect. Vascular: Negative for hyperdense vessel Skull: Negative Sinuses/Orbits: Paranasal sinuses clear. Bilateral cataract extraction Other: None CT CERVICAL SPINE FINDINGS Alignment: Normal Skull base and vertebrae: Negative for fracture Soft tissues and spinal canal: 5 mm left thyroid nodule. No follow-up imaging recommended. (Ref: J Am Coll Radiol. 2015 Feb;12(2): 143-50). Disc levels: Minimal degenerative change. Negative for cervical stenosis Upper chest: Lung apices clear bilaterally Other: None IMPRESSION: Negative CT head and cervical spine.  No acute injury Electronically Signed   By: Marlan Palau M.D.   On: 05/02/2022 15:42   CT Cervical Spine Wo  Contrast  Result Date: 05/02/2022 CLINICAL DATA:  Fall with head injury 1 week ago. Dizziness and headache. Neck pain. EXAM: CT HEAD WITHOUT CONTRAST CT CERVICAL SPINE WITHOUT CONTRAST TECHNIQUE: Multidetector CT imaging of the head and cervical spine was performed following the standard protocol without intravenous contrast. Multiplanar CT image reconstructions of the cervical spine were also generated. RADIATION DOSE REDUCTION: This exam was performed according to the departmental dose-optimization program which includes automated exposure control, adjustment of the mA and/or kV according to patient size and/or use of iterative reconstruction technique. COMPARISON:  None Available. FINDINGS: CT HEAD FINDINGS Brain: No evidence of acute infarction, hemorrhage, hydrocephalus, extra-axial collection or mass lesion/mass effect.  Vascular: Negative for hyperdense vessel Skull: Negative Sinuses/Orbits: Paranasal sinuses clear. Bilateral cataract extraction Other: None CT CERVICAL SPINE FINDINGS Alignment: Normal Skull base and vertebrae: Negative for fracture Soft tissues and spinal canal: 5 mm left thyroid nodule. No follow-up imaging recommended. (Ref: J Am Coll Radiol. 2015 Feb;12(2): 143-50). Disc levels: Minimal degenerative change. Negative for cervical stenosis Upper chest: Lung apices clear bilaterally Other: None IMPRESSION: Negative CT head and cervical spine.  No acute injury Electronically Signed   By: Marlan Palau M.D.   On: 05/02/2022 15:42    Assessment and Plan: This is a 31 y.o. male with abdominal pain.  --Discussed with the patient the findings on his CT scans and laboratory workup.  His WBC is normal, and his CT scan only shows some mild thickening of the appendix, without any periappendiceal stranding or other inflammatory changes for appendicitis.  His appendix is also retrocecal going towards the right upper quadrant, so the location of his pain also does not correlate well with the  location of the appendix.  Also, appendicitis should not be causing stool incontinence.  At this point, do not have convincing evidence to support appendicitis and offer appendectomy. --Discussed with Dr. Scotty Court about possible options.  If he were to stay in hospital due to the neurologic symptoms, then we can observe him as well and if his pain worsens, possibly do appendectomy.  If he were to be discharged home, then he should monitor his pain for the next 1-2 days and if there's worsening, to come back for further evaluation and possible surgery.  After discussing with Dr. Scotty Court, patient is likely to be discharged.  Return precautions given and he shows full understanding.   Howie Ill, MD [facility] Pg:  [phone]

## 2022-05-02 NOTE — ED Triage Notes (Addendum)
Pt reports hitting his head on an oak dresser and became dizzy shortly after that 1 wee ago. Since then has developed nausea, a migraine and is unable to hold his stool.

## 2022-05-02 NOTE — ED Provider Notes (Signed)
[facility] Provider Note    Event Date/Time   First MD Initiated Contact with Patient 05/02/22 1429     (approximate)   History   Chief Complaint: Head Injury and Dizziness   HPI  Jimmy Hale is a 31 y.o. male with no significant past medical history who comes the ED complaining of dizziness, frontal headache, and mid neck pain for the past week, ever since he had a mechanical fall causing him to hit his forehead on an open dresser.  He had loss of consciousness at that time.  Also notes that during this time he has been having stool incontinence, and the night that he fell he also felt like he had a fever and had multiple episodes of vomiting.  No vision change.  No motor weakness, but he does describe paresthesia in bilateral arms.  Denies any known allergies.  Denies chest pain or shortness of breath.  This all occurred in the midst of him moving from 1 residence to another, during which time he was sleeping in his truck.     Physical Exam   Triage Vital Signs: ED Triage Vitals  Enc Vitals Group     BP 05/02/22 1412 114/84     Pulse Rate 05/02/22 1412 79     Resp 05/02/22 1412 16     Temp 05/02/22 1412 98.8 F (37.1 C)     Temp Source 05/02/22 1412 Oral     SpO2 05/02/22 1412 97 %     Weight 05/02/22 1414 190 lb (86.2 kg)     Height 05/02/22 1414 6\' 3"  (1.905 m)     Head Circumference --      Peak Flow --      Pain Score 05/02/22 1414 6     Pain Loc --      Pain Edu? --      Excl. in GC? --     Most recent vital signs: Vitals:   05/02/22 1412 05/02/22 1717  BP: 114/84 118/80  Pulse: 79 80  Resp: 16 16  Temp: 98.8 F (37.1 C) 98 F (36.7 C)  SpO2: 97% 98%    General: Awake, no distress.  CV:  Good peripheral perfusion.  Regular rate and rhythm.  No murmurs Resp:  Normal effort.  Clear to auscultation bilaterally Abd:  No distention.  Soft with right lower quadrant tenderness Other:  No edema, moist oral mucosa,   ED  Results / Procedures / Treatments   Labs (all labs ordered are listed, but only abnormal results are displayed) Labs Reviewed  COMPREHENSIVE METABOLIC PANEL - Abnormal; Notable for the following components:      Result Value   Creatinine, Ser 1.32 (*)    All other components within normal limits  RESP PANEL BY RT-PCR (FLU A&B, COVID) [id number]  CBC WITH DIFFERENTIAL/PLATELET  LIPASE, BLOOD     EKG    RADIOLOGY CT head interpreted by me, negative for intracranial hemorrhage.  Radiology report reviewed.  CT cervical spine unremarkable.  CT abdomen pelvis shows mildly thickened appendix, equivocal for appendicitis.   PROCEDURES:  Procedures   MEDICATIONS ORDERED IN ED: Medications  sodium chloride 0.9 % bolus 1,000 mL (1,000 mLs Intravenous New Bag/Given 05/02/22 1452)  ondansetron (ZOFRAN) injection 4 mg (4 mg Intravenous Given 05/02/22 1453)  ketorolac (TORADOL) 15 MG/ML injection 15 mg (15 mg Intravenous Given 05/02/22 1453)  iohexol (OMNIPAQUE) 300 MG/ML solution 100 mL (100 mLs Intravenous Contrast Given 05/02/22 1528)  IMPRESSION / MDM / ASSESSMENT AND PLAN / ED COURSE  I reviewed the triage vital signs and the nursing notes.                              Differential diagnosis includes, but is not limited to, intracranial hemorrhage, concussion, dehydration, AKI, electrolyte abnormality, appendicitis  Patient's presentation is most consistent with acute presentation with potential threat to life or bodily function.  Patient presents with head trauma and neurologic symptoms 1 week afterward.  Most likely concussion but will need to obtain CT head to rule out intracranial hemorrhage.  With his neck pain, will also obtain CT cervical spine even though he has been ambulatory.  ----------------------------------------- 3:51 PM on 05/02/2022 ----------------------------------------- CT head and neck unremarkable.  CT abdomen is equivocal for appendicitis, will discuss with  surgery.   ----------------------------------------- 6:30 PM on 05/02/2022 ----------------------------------------- Patient seen by general surgery and I discussed the patient's presentation with Dr. Aleen Campi after his examination.  He finds this presentation atypical for appendicitis and given the time course of symptoms over the past week, highly unlikely to be due to an acute appendectomy.  Vitals are normal, white count is normal.  Suspect he may have a viral gastroenteritis along with the concussion symptoms.  We will treat with Zofran naproxen Imodium and Pepcid.  Return precautions.      FINAL CLINICAL IMPRESSION(S) / ED DIAGNOSES   Final diagnoses:  Concussion with loss of consciousness, initial encounter  Right lower quadrant abdominal pain     Rx / DC Orders   ED Discharge Orders          Ordered    ondansetron (ZOFRAN-ODT) 4 MG disintegrating tablet  Every 8 hours PRN        05/02/22 1829    loperamide (IMODIUM A-D) 2 MG tablet  4 times daily PRN        05/02/22 1829    famotidine (PEPCID) 20 MG tablet  2 times daily        05/02/22 1829    naproxen (NAPROSYN) 500 MG tablet  2 times daily with meals        05/02/22 1829             Note:  This document was prepared using Dragon voice recognition software and may include unintentional dictation errors.   Sharman Cheek, MD 05/02/22 [phone]

## 2022-05-02 NOTE — ED Notes (Signed)
Patient is being discharged from the Urgent Care and sent to the Emergency Department via personal vehicle . Per Becky Augusta, NP, patient is in need of higher level of care due to neuro deficits. Patient is aware and verbalizes understanding of plan of care.  Vitals:   05/02/22 1249  BP: 111/74  Pulse: 88  Resp: 16  Temp: 98 F (36.7 C)  SpO2: 97%

## 2022-05-02 NOTE — ED Provider Notes (Signed)
[facility]    CSN: [id number] Arrival date & time: 05/02/22  1203      History   Chief Complaint Chief Complaint  Patient presents with   Dizziness   Migraine   Diarrhea    HPI Jimmy Hale is a 31 y.o. male.   HPI  31 year old male here for evaluation of neurologic complaints.  Patient reports that 1 week ago he was standing on his bed, which is about 2 feet near, when he tripped and fell and struck his forehead on an open dresser.  He reports he did have a loss of consciousness and since that time he has had headache, dizziness, nausea and vomiting, intermittent blurry vision, tingling in his arms, significant neck pain, and incontinence of stool.  History reviewed. No pertinent past medical history.  There are no problems to display for this patient.   Past Surgical History:  Procedure Laterality Date   NO PAST SURGERIES         Home Medications    Prior to Admission medications   Medication Sig Start Date End Date Taking? Authorizing Provider  albuterol (VENTOLIN HFA) 108 (90 Base) MCG/ACT inhaler Inhale 1-2 puffs into the lungs every 4 (four) hours as needed for up to 10 days for wheezing or shortness of breath. 08/21/20 08/31/20  Eusebio Friendly B, PA-C  azithromycin (ZITHROMAX) 250 MG tablet Take 1 tablet (250 mg total) by mouth daily. Take first 2 tablets together, then 1 every day until finished. 08/21/20   Shirlee Latch, PA-C    Family History Family History  Problem Relation Age of Onset   Healthy Mother    Healthy Father     Social History Social History   Tobacco Use   Smoking status: Every Day    Types: Cigarettes   Smokeless tobacco: Current    Types: Snuff  Vaping Use   Vaping Use: Some days  Substance Use Topics   Alcohol use: Yes    Comment: social   Drug use: No     Allergies   Patient has no known allergies.   Review of Systems Review of Systems  Eyes:  Positive for visual disturbance.  Gastrointestinal:   Positive for nausea and vomiting.  Musculoskeletal:  Positive for neck pain.  Skin:  Negative for color change.  Neurological:  Positive for dizziness, syncope, weakness, numbness and headaches.  Hematological: Negative.      Physical Exam Triage Vital Signs ED Triage Vitals  Enc Vitals Group     BP 05/02/22 1249 111/74     Pulse Rate 05/02/22 1249 88     Resp 05/02/22 1249 16     Temp 05/02/22 1249 98 F (36.7 C)     Temp Source 05/02/22 1249 Oral     SpO2 05/02/22 1249 97 %     Weight --      Height --      Head Circumference --      Peak Flow --      Pain Score 05/02/22 1247 7     Pain Loc --      Pain Edu? --      Excl. in GC? --    No data found.  Updated Vital Signs BP 111/74 (BP Location: Right Arm)   Pulse 88   Temp 98 F (36.7 C) (Oral)   Resp 16   SpO2 97%   Visual Acuity Right Eye Distance:   Left Eye Distance:   Bilateral Distance:    Right  Eye Near:   Left Eye Near:    Bilateral Near:     Physical Exam Vitals and nursing note reviewed.  Constitutional:      General: He is not in acute distress.    Appearance: Normal appearance.  HENT:     Mouth/Throat:     Mouth: Mucous membranes are moist.     Pharynx: Oropharynx is clear. No oropharyngeal exudate or posterior oropharyngeal erythema.  Eyes:     General: No scleral icterus.    Extraocular Movements: Extraocular movements intact.     Conjunctiva/sclera: Conjunctivae normal.     Pupils: Pupils are equal, round, and reactive to light.  Cardiovascular:     Rate and Rhythm: Normal rate and regular rhythm.     Pulses: Normal pulses.     Heart sounds: Normal heart sounds. No murmur heard.    No friction rub. No gallop.  Pulmonary:     Effort: Pulmonary effort is normal.     Breath sounds: Normal breath sounds. No wheezing, rhonchi or rales.  Musculoskeletal:        General: Tenderness and signs of injury present. No swelling or deformity.     Cervical back: Normal range of motion and neck  supple. Tenderness present.  Skin:    General: Skin is warm and dry.     Capillary Refill: Capillary refill takes less than 2 seconds.     Findings: No bruising.  Neurological:     General: No focal deficit present.     Mental Status: He is alert and oriented to person, place, and time.     Cranial Nerves: No cranial nerve deficit.     Motor: Weakness present.     Gait: Gait abnormal.     Deep Tendon Reflexes: Reflexes abnormal.  Psychiatric:     Comments: Patient has a flat affect.      UC Treatments / Results  Labs (all labs ordered are listed, but only abnormal results are displayed) Labs Reviewed - No data to display  EKG   Radiology No results found.  Procedures Procedures (including critical care time)  Medications Ordered in UC Medications - No data to display  Initial Impression / Assessment and Plan / UC Course  I have reviewed the triage vital signs and the nursing notes.  Pertinent labs & imaging results that were available during my care of the patient were reviewed by me and considered in my medical decision making (see chart for details).  Patient is a 31 year old male here for evaluation of neurologic complaints as outlined in the HPI above.  Patient has a very flat affect but he does answer questions appropriately and he is oriented x3.  Not have any significant past medical or surgical history.  Per his report, 1 week ago he was standing on his bed when he tripped and fell and struck his forehead on the dresser.  Since that time he has had headache, dizziness, nausea and vomiting, tingling in his arms, neck pain, intermittent blurry vision, and incontinence of stool.  He did suffer a loss of consciousness for an undetermined length of time at the initial injury.  Patient's physical exam reveals cranial nerves II through XII intact.  Pupils are equal round reactive and EOMs intact.  He does have significant tenderness when palpating the spinal process at C3  and C4.  No appreciable step-off.  He also has bilateral paraspinous tenderness but no significant spasm.  Palpation of the remainder of the cervical, thoracic, and lumbar  spine did not reveal any tenderness or step-off.  Cardiopulmonary exam reveals S1-S2 heart sounds with regular rate and rhythm and lung sounds that are clear to auscultation in all fields.  Patient does have slightly decreased grip and right upper extremity strength at 4/5 versus 5/5 on the left.  Lower extremity strength is also mildly decreased on the right at 4/5 versus 5/5 on the left.  DTRs show a decrease in his patellar reflex only.  The remainder of his reflexes are all 2+.  Patient does have a normal tandem gait and he is able to walk on the balls of his feet and the heels of his feet without difficulty.  Given the slight decrease in strength, coupled with the loss of consciousness, neck pain, headache, dizziness and stool incontinence are concerning for possible neurologic insult.  I do feel the patient would be better served to be evaluated in the emergency department.  He did drive himself here and I think he is able to drive himself to the ER.  He has elected to go to [facility].   Final Clinical Impressions(s) / UC Diagnoses   Final diagnoses:  Closed head injury with brief loss of consciousness (HCC)  Incontinence of feces, unspecified fecal incontinence type  Dizziness  Intractable acute post-traumatic headache     Discharge Instructions      Please go to [facility] for evaluation of your neurologic issues.  Please go now.     ED Prescriptions   None    PDMP not reviewed this encounter.   Becky Augusta, NP 05/02/22 1320

## 2022-05-02 NOTE — Discharge Instructions (Addendum)
Please go to [facility] for evaluation of your neurologic issues.  Please go now.

## 2022-05-02 NOTE — ED Triage Notes (Signed)
Pt c/o dizziness, headache, and neck pain since fall and hitting his head on a dresser x1 week ago and unable to hold bowel movements since yesterday.  Pain score 6/10.   Pt was seen at UC earlier and sent to ED for further evaluation.

## 2022-05-09 ENCOUNTER — Emergency Department: Payer: Self-pay

## 2022-05-09 ENCOUNTER — Emergency Department
Admission: EM | Admit: 2022-05-09 | Discharge: 2022-05-09 | Disposition: A | Discharge status: Home / Self Care | Payer: Self-pay | Attending: Emergency Medicine | Admitting: Emergency Medicine

## 2022-05-09 ENCOUNTER — Encounter: Payer: Self-pay | Admitting: Emergency Medicine

## 2022-05-09 ENCOUNTER — Other Ambulatory Visit: Payer: Self-pay

## 2022-05-09 DIAGNOSIS — R197 Diarrhea, unspecified: Secondary | ICD-10-CM | POA: Insufficient documentation

## 2022-05-09 DIAGNOSIS — R112 Nausea with vomiting, unspecified: Secondary | ICD-10-CM | POA: Insufficient documentation

## 2022-05-09 DIAGNOSIS — R1084 Generalized abdominal pain: Secondary | ICD-10-CM | POA: Insufficient documentation

## 2022-05-09 DIAGNOSIS — R630 Anorexia: Secondary | ICD-10-CM | POA: Insufficient documentation

## 2022-05-09 LAB — COMPREHENSIVE METABOLIC PANEL
ALT: 27 U/L (ref 0–44)
AST: 23 U/L (ref 15–41)
Albumin: 4.9 g/dL (ref 3.5–5.0)
Alkaline Phosphatase: 60 U/L (ref 38–126)
Anion gap: 7 (ref 5–15)
BUN: 19 mg/dL (ref 6–20)
CO2: 25 mmol/L (ref 22–32)
Calcium: 9.5 mg/dL (ref 8.9–10.3)
Chloride: 108 mmol/L (ref 98–111)
Creatinine, Ser: 1.32 mg/dL — ABNORMAL HIGH (ref 0.61–1.24)
GFR, Estimated: >60 mL/min (ref >60)
Glucose, Bld: 104 mg/dL — ABNORMAL HIGH (ref 70–99)
Potassium: 3.8 mmol/L (ref 3.5–5.1)
Sodium: 140 mmol/L (ref 135–145)
Total Bilirubin: 0.8 mg/dL (ref 0.3–1.2)
Total Protein: 7.8 g/dL (ref 6.5–8.1)

## 2022-05-09 LAB — CBC
HCT: 47.1 % (ref 39.0–52.0)
Hemoglobin: 16.1 g/dL (ref 13.0–17.0)
MCH: 30.3 pg (ref 26.0–34.0)
MCHC: 34.2 g/dL (ref 30.0–36.0)
MCV: 88.5 fL (ref 80.0–100.0)
Platelets: 261 10*3/uL (ref 150–400)
RBC: 5.32 MIL/uL (ref 4.22–5.81)
RDW: 12.3 % (ref 11.5–15.5)
WBC: 6.8 10*3/uL (ref 4.0–10.5)
nRBC: 0 % (ref 0.0–0.2)

## 2022-05-09 LAB — URINALYSIS, ROUTINE W REFLEX MICROSCOPIC
Bilirubin Urine: NEGATIVE
Glucose, UA: NEGATIVE mg/dL
Hgb urine dipstick: NEGATIVE
Ketones, ur: NEGATIVE mg/dL
Leukocytes,Ua: NEGATIVE
Nitrite: NEGATIVE
Protein, ur: NEGATIVE mg/dL
Specific Gravity, Urine: 1.019 (ref 1.005–1.030)
pH: 5 (ref 5.0–8.0)

## 2022-05-09 LAB — LIPASE, BLOOD: Lipase: 47 U/L (ref 11–51)

## 2022-05-09 MED ORDER — OXYCODONE-ACETAMINOPHEN 5-325 MG PO TABS: 1.0000 | ORAL_TABLET | ORAL | 0 refills | Status: DC | PRN

## 2022-05-09 MED ORDER — IOHEXOL 300 MG/ML  SOLN
100.0000 mL | Freq: Once | INTRAMUSCULAR | Status: AC | PRN
Start: 2022-05-09 — End: 2022-05-09
  Administered 2022-05-09: 100 mL via INTRAVENOUS

## 2022-05-09 MED ORDER — ONDANSETRON HCL 4 MG/2ML IJ SOLN
4.0000 mg | Freq: Once | INTRAMUSCULAR | Status: AC
Start: 2022-05-09 — End: 2022-05-09
  Administered 2022-05-09: 4 mg via INTRAVENOUS
  Filled 2022-05-09: qty 2

## 2022-05-09 MED ORDER — LACTATED RINGERS IV BOLUS
1000.0000 mL | Freq: Once | INTRAVENOUS | Status: AC
  Administered 2022-05-09: 1000 mL via INTRAVENOUS

## 2022-05-09 MED ORDER — PIPERACILLIN-TAZOBACTAM 3.375 G IVPB 30 MIN
3.3750 g | Freq: Once | INTRAVENOUS | Status: AC
  Administered 2022-05-09: 3.375 g via INTRAVENOUS
  Filled 2022-05-09: qty 50

## 2022-05-09 MED ORDER — MORPHINE SULFATE (PF) 4 MG/ML IV SOLN
4.0000 mg | Freq: Once | INTRAVENOUS | Status: AC
  Administered 2022-05-09: 4 mg via INTRAVENOUS
  Filled 2022-05-09: qty 1

## 2022-05-09 MED ORDER — AMOXICILLIN-POT CLAVULANATE 875-125 MG PO TABS: 1.0000 | ORAL_TABLET | Freq: Two times a day (BID) | ORAL | 0 refills | Status: DC

## 2022-05-09 MED ORDER — ONDANSETRON 4 MG PO TBDP: 4.0000 mg | ORAL_TABLET | Freq: Three times a day (TID) | ORAL | 0 refills | Status: AC | PRN

## 2022-05-09 NOTE — ED Provider Notes (Signed)
[facility] Provider Note    Event Date/Time   First MD Initiated Contact with Patient 05/09/22 1217     (approximate)   History   Chief Complaint Abdominal Pain   HPI  Jimmy Hale is a 31 y.o. male with no significant past medical history who presents to the ED complaining of abdominal pain.  Patient reports that he has had about 1 week of worsening pain across much of his abdomen.  He describes the pain as constant and sharp, worse when he moves around.  He has had a decreased appetite with nausea at times since onset of symptoms, did have 1 episode of vomiting yesterday.  He has been having watery diarrhea and earlier today noticed a small amount of blood in his stool.  He describes this as small streaks of bright red blood mixed with the stool, denies any black or tarry stool.  He was seen in the ED 1 week ago for concussion as well as some abdominal pain and diarrhea.  CT scan at that time of his abdomen showed equivocal appendicitis, however he was evaluated by general surgery and appendicitis thought unlikely.     Physical Exam   Triage Vital Signs: ED Triage Vitals  Enc Vitals Group     BP 05/09/22 1130 122/86     Pulse Rate 05/09/22 1130 75     Resp 05/09/22 1130 16     Temp 05/09/22 1130 98.5 F (36.9 C)     Temp Source 05/09/22 1130 Oral     SpO2 05/09/22 1130 97 %     Weight 05/09/22 1131 189 lb 9.5 oz (86 kg)     Height 05/09/22 1131 6\' 3"  (1.905 m)     Head Circumference --      Peak Flow --      Pain Score 05/09/22 1131 6     Pain Loc --      Pain Edu? --      Excl. in GC? --     Most recent vital signs: Vitals:   05/09/22 1537 05/09/22 1631  BP: 122/78 120/80  Pulse: 78 80  Resp: 16 16  Temp: 98 F (36.7 C) 98.4 F (36.9 C)  SpO2: 98% 98%    Constitutional: Alert and oriented. Eyes: Conjunctivae are normal. Head: Atraumatic. Nose: No congestion/rhinnorhea. Mouth/Throat: Mucous membranes are moist.  Cardiovascular:  Normal rate, regular rhythm. Grossly normal heart sounds.  2+ radial pulses bilaterally. Respiratory: Normal respiratory effort.  No retractions. Lungs CTAB. Gastrointestinal: Soft and diffusely tender to palpation, greatest in the right upper and lower quadrants with no rebound or guarding.  No CVA tenderness bilaterally. No distention. Musculoskeletal: No lower extremity tenderness nor edema.  Neurologic:  Normal speech and language. No gross focal neurologic deficits are appreciated.    ED Results / Procedures / Treatments   Labs (all labs ordered are listed, but only abnormal results are displayed) Labs Reviewed  COMPREHENSIVE METABOLIC PANEL - Abnormal; Notable for the following components:      Result Value   Glucose, Bld 104 (*)    Creatinine, Ser 1.32 (*)    All other components within normal limits  URINALYSIS, ROUTINE W REFLEX MICROSCOPIC - Abnormal; Notable for the following components:   Color, Urine YELLOW (*)    APPearance HAZY (*)    All other components within normal limits  LIPASE, BLOOD  CBC   RADIOLOGY CT abdomen/pelvis reviewed and interpreted by me with dilated appendix without associated inflammatory changes,  no dilated bowel loops or other inflammatory changes noted.  PROCEDURES:  Critical Care performed: No  Procedures   MEDICATIONS ORDERED IN ED: Medications  lactated ringers bolus 1,000 mL (0 mLs Intravenous Stopped 05/09/22 1631)  morphine (PF) 4 MG/ML injection 4 mg (4 mg Intravenous Given 05/09/22 1326)  ondansetron (ZOFRAN) injection 4 mg (4 mg Intravenous Given 05/09/22 1325)  iohexol (OMNIPAQUE) 300 MG/ML solution 100 mL (100 mLs Intravenous Contrast Given 05/09/22 1341)  piperacillin-tazobactam (ZOSYN) IVPB 3.375 g (3.375 g Intravenous New Bag/Given 05/09/22 1615)     IMPRESSION / MDM / ASSESSMENT AND PLAN / ED COURSE  I reviewed the triage vital signs and the nursing notes.                              31 y.o. male with no significant  past medical history who presents to the ED with 1 week of worsening diffuse abdominal pain that seems to be worse on the right side, associated with nausea, diarrhea, and small amount of blood in his stool earlier today.  Patient's presentation is most consistent with acute presentation with potential threat to life or bodily function.  Differential diagnosis includes, but is not limited to, appendicitis, colitis, inflammatory bowel disease, gastroenteritis, GI bleed, diverticulitis.  Patient well-appearing and in no acute distress, vital signs are unremarkable. Patient's described bleeding seems relatively mild and his hemoglobin is stable, doubt significant GI bleeding at this time.  Given his tenderness and equivocal appendicitis seen on prior imaging, we will repeat CT scan to assess for appendicitis versus evidence of colitis and possible inflammatory bowel disease.  Labs thus far are reassuring without significant electrolyte abnormality or AKI, LFTs and lipase within normal limits.  Urinalysis shows no signs of infection and no hematuria to suggest kidney stone.  We will treat symptomatically with IV morphine and Zofran, hydrate with IV fluids and reassess.  CT scan appears similar to previous with dilated appendix but no associated inflammatory changes noted.  Patient reports feeling better on reassessment however he continues to be tender to palpation in his right upper and lower quadrants.  Case discussed with Dr. Aleen Campi of general surgery, who evaluated the patient.  Overall low suspicion for acute appendicitis at this time given atypical presentation, plan to treat with antibiotics with close outpatient follow-up per general surgery.  Patient given dose of IV Zosyn here in the ED and we will discharge home on Augmentin.  He was also prescribed short course of pain and nausea medication, was counseled to return to the ED for new or worsening symptoms.  Patient agrees with plan.      FINAL  CLINICAL IMPRESSION(S) / ED DIAGNOSES   Final diagnoses:  Generalized abdominal pain     Rx / DC Orders   ED Discharge Orders          Ordered    amoxicillin-clavulanate (AUGMENTIN) 875-125 MG tablet  2 times daily        05/09/22 1703    oxyCODONE-acetaminophen (PERCOCET) 5-325 MG tablet  Every 4 hours PRN        05/09/22 1703    ondansetron (ZOFRAN-ODT) 4 MG disintegrating tablet  Every 8 hours PRN        05/09/22 1703             Note:  This document was prepared using Dragon voice recognition software and may include unintentional dictation errors.   Chesley Noon, MD 05/09/22  1705  

## 2022-05-09 NOTE — Consult Note (Signed)
Date of Consultation:  05/09/2022  Requesting Physician:  Chesley Noon, MD  Reason for Consultation: Abdominal pain  History of Present Illness: Jimmy Hale is a 31 y.o. male presenting for evaluation of ongoing for a few the patient came to the emergency room a week ago on 05/02/2022 with some discomfort in the right lower quadrant of the abdomen.  At that point he also had a complaint of diarrhea with incontinence of stool.  He had had a recent fall where he hit his head and was also having issues with dizziness and blurry vision.  He was diagnosed with a concussion and then also CT abdomen pelvis was obtained due to his abdominal discomfort which showed concerning for a thickened appendix 8 mm in retrocecal position, but without any periappendiceal stranding or fluid.  At that point, on exam the location of his tenderness was in the lower pelvis towards the right lower quadrant with no diffuse peritonitis.  There is no convincing evidence to support appendicitis and offered the patient appendectomy and he was discharged home with return precautions.  The patient reports that since then, he has continued having some worsening discomfort and now the discomfort has become a little more spread mostly on the right but also going to the left of midline.  The pain is still more in the right lower quadrant but also in the right side.  Denies any fevers or chills but he continues having some diarrhea and noticed yesterday having some blood in the stool.  He feels that the concussion symptoms have improved.  He had a repeat workup in the emergency room today with again unremarkable labs with a stable creatinine of 1.32 and a normal white blood cell count of 6.8.  He had a repeat CT scan as well which have personally viewed and the appendix is pretty much the same in appearance with some wall thickening measuring 8 mm in diameter but without any evidence of periappendiceal stranding, fluid, or any inflammatory  changes.  We have been consulted to evaluate him again for possible appendicitis.  Past Medical History: History reviewed. No pertinent past medical history.   Past Surgical History: Past Surgical History:  Procedure Laterality Date   NO PAST SURGERIES      Home Medications: Prior to Admission medications   Medication Sig Start Date End Date Taking? Authorizing Provider  amoxicillin-clavulanate (AUGMENTIN) 875-125 MG tablet Take 1 tablet by mouth 2 (two) times daily for 7 days. 05/09/22 05/16/22 Yes Chesley Noon, MD  ondansetron (ZOFRAN-ODT) 4 MG disintegrating tablet Take 1 tablet (4 mg total) by mouth every 8 (eight) hours as needed for nausea or vomiting. 05/09/22  Yes Chesley Noon, MD  oxyCODONE-acetaminophen (PERCOCET) 5-325 MG tablet Take 1 tablet by mouth every 4 (four) hours as needed for severe pain. 05/09/22 05/09/23 Yes Chesley Noon, MD  albuterol (VENTOLIN HFA) 108 (90 Base) MCG/ACT inhaler Inhale 1-2 puffs into the lungs every 4 (four) hours as needed for up to 10 days for wheezing or shortness of breath. 08/21/20 08/31/20  Shirlee Latch, PA-C  famotidine (PEPCID) 20 MG tablet Take 1 tablet (20 mg total) by mouth 2 (two) times daily. 05/02/22   Sharman Cheek, MD  loperamide (IMODIUM A-D) 2 MG tablet Take 2 tablets (4 mg total) by mouth 4 (four) times daily as needed for diarrhea or loose stools. 05/02/22   Sharman Cheek, MD  naproxen (NAPROSYN) 500 MG tablet Take 1 tablet (500 mg total) by mouth 2 (two) times daily with a  meal. 05/02/22   Sharman Cheek, MD    Allergies: No Known Allergies  Social History:  reports that he has quit smoking. His smoking use included cigarettes. His smokeless tobacco use includes snuff. He reports current alcohol use. He reports that he does not use drugs.   Family History: Family History  Problem Relation Age of Onset   Healthy Mother    Healthy Father     Review of Systems: Review of Systems  Constitutional:  Negative for  chills and fever.  HENT:  Negative for hearing loss.   Respiratory:  Negative for shortness of breath.   Cardiovascular:  Negative for chest pain.  Gastrointestinal:  Positive for abdominal pain, diarrhea and nausea. Negative for vomiting.  Genitourinary:  Negative for dysuria.  Musculoskeletal:  Negative for myalgias.  Skin:  Negative for rash.  Neurological:  Positive for dizziness.  Psychiatric/Behavioral:  Negative for depression.     Physical Exam BP 120/80 (BP Location: Left Arm)   Pulse 80   Temp 98.4 F (36.9 C) (Oral)   Resp 16   Ht 6\' 3"  (1.905 m)   Wt 86 kg   SpO2 98%   BMI 23.70 kg/m  CONSTITUTIONAL: No acute distress, well-nourished HEENT:  Normocephalic, atraumatic, extraocular motion intact. NECK: Trachea is midline, and there is no jugular venous distension. RESPIRATORY:  Normal respiratory effort without pathologic use of accessory muscles. CARDIOVASCULAR: Regular rhythm and rate. GI: The abdomen is soft, nondistended, with some diffuse tenderness to palpation in the lower abdomen, right lower quadrant, right side, and to the left of midline.  Nontoxic overall. MUSCULOSKELETAL:  Normal muscle strength and tone in all four extremities.  No peripheral edema or cyanosis. SKIN: Skin turgor is normal. There are no pathologic skin lesions.  NEUROLOGIC:  Motor and sensation is grossly normal.  Cranial nerves are grossly intact. PSYCH:  Alert and oriented to person, place and time. Affect is normal.  Laboratory Analysis: Results for orders placed or performed during the hospital encounter of 05/09/22 (from the past 24 hour(s))  Lipase, blood     Status: None   Collection Time: 05/09/22 11:49 AM  Result Value Ref Range   Lipase 47 11 - 51 U/L  Comprehensive metabolic panel     Status: Abnormal   Collection Time: 05/09/22 11:49 AM  Result Value Ref Range   Sodium 140 135 - 145 mmol/L   Potassium 3.8 3.5 - 5.1 mmol/L   Chloride 108 98 - 111 mmol/L   CO2 25 22 - 32  mmol/L   Glucose, Bld 104 (H) 70 - 99 mg/dL   BUN 19 6 - 20 mg/dL   Creatinine, Ser 07/09/22 (H) 0.61 - 1.24 mg/dL   Calcium 9.5 8.9 - 1.61 mg/dL   Total Protein 7.8 6.5 - 8.1 g/dL   Albumin 4.9 3.5 - 5.0 g/dL   AST 23 15 - 41 U/L   ALT 27 0 - 44 U/L   Alkaline Phosphatase 60 38 - 126 U/L   Total Bilirubin 0.8 0.3 - 1.2 mg/dL   GFR, Estimated 09.6 >04 mL/min   Anion gap 7 5 - 15  CBC     Status: None   Collection Time: 05/09/22 11:49 AM  Result Value Ref Range   WBC 6.8 4.0 - 10.5 K/uL   RBC 5.32 4.22 - 5.81 MIL/uL   Hemoglobin 16.1 13.0 - 17.0 g/dL   HCT 07/09/22 09.8 - 11.9 %   MCV 88.5 80.0 - 100.0 fL   MCH 30.3  26.0 - 34.0 pg   MCHC 34.2 30.0 - 36.0 g/dL   RDW 72.0 94.7 - 09.6 %   Platelets 261 150 - 400 K/uL   nRBC 0.0 0.0 - 0.2 %  Urinalysis, Routine w reflex microscopic Urine, Clean Catch     Status: Abnormal   Collection Time: 05/09/22 11:49 AM  Result Value Ref Range   Color, Urine YELLOW (A) YELLOW   APPearance HAZY (A) CLEAR   Specific Gravity, Urine 1.019 1.005 - 1.030   pH 5.0 5.0 - 8.0   Glucose, UA NEGATIVE NEGATIVE mg/dL   Hgb urine dipstick NEGATIVE NEGATIVE   Bilirubin Urine NEGATIVE NEGATIVE   Ketones, ur NEGATIVE NEGATIVE mg/dL   Protein, ur NEGATIVE NEGATIVE mg/dL   Nitrite NEGATIVE NEGATIVE   Leukocytes,Ua NEGATIVE NEGATIVE    Imaging: CT Abdomen Pelvis W Contrast  Result Date: 05/09/2022 CLINICAL DATA:  Right lower quadrant abdominal pain. EXAM: CT ABDOMEN AND PELVIS WITH CONTRAST TECHNIQUE: Multidetector CT imaging of the abdomen and pelvis was performed using the standard protocol following bolus administration of intravenous contrast. RADIATION DOSE REDUCTION: This exam was performed according to the departmental dose-optimization program which includes automated exposure control, adjustment of the mA and/or kV according to patient size and/or use of iterative reconstruction technique. CONTRAST:  OMNIPAQUE IOHEXOL 300 MG/ML  SOLN COMPARISON:  April 30, 2022. FINDINGS: Lower chest: No acute abnormality. Hepatobiliary: No focal liver abnormality is seen. No gallstones, gallbladder wall thickening, or biliary dilatation. Pancreas: Unremarkable. No pancreatic ductal dilatation or surrounding inflammatory changes. Spleen: Normal in size without focal abnormality. Adrenals/Urinary Tract: Adrenal glands are unremarkable. Kidneys are normal, without renal calculi, focal lesion, or hydronephrosis. Bladder is unremarkable. Stomach/Bowel: Stomach is unremarkable. There is no evidence of bowel obstruction. The appendix is grossly stable in appearance. It measures approximately 8 mm in maximum diameter. There is no evidence of significant inflammation surrounding it. However, the appendiceal wall remains mildly thickened and hyperemic. It is retrocecal in position. Vascular/Lymphatic: No significant vascular findings are present. No enlarged abdominal or pelvic lymph nodes. Reproductive: Prostate is unremarkable. Other: No abdominal wall hernia or abnormality. No abdominopelvic ascites. Musculoskeletal: No acute or significant osseous findings. IMPRESSION: Stable appearance of the appendix which has a maximum measured diameter of 8 mm. There is no surrounding inflammation, but the appendiceal wall remains mildly thickened and hyperemic. Given the lack of change, it is felt that acute appendicitis is unlikely, but continued clinical follow-up is recommended. No other definite abnormality is noted in the abdomen. Electronically Signed   By: Lupita Raider M.D.   On: 05/09/2022 14:00    Assessment and Plan: This is a 31 y.o. male with abdominal pain.  - The patient presents with spreading diffuse although not severe tenderness to palpation.  However his white blood cell count remains normal, and his CT scan does not show any evidence of periappendiceal stranding or inflammatory changes.  He never received antibiotics after his last visit so now he has been 7 days without  any antibiotics without any changes in the right lower quadrant on CT scan to support a true diagnosis of appendicitis.  He may have some degree of colitis given the diarrhea and bloody stool or potentially inflammatory bowel disease.  I discussed with the patient that unfortunately still there is no convincing evidence of appendicitis and appendectomy be an unnecessary surgery.  Discussed with him the possibility of a trial of antibiotics to see if his colitis could improve rather than take  him to the operating room in a rushed manner.  If his symptoms do not improve with the oral antibiotics in a few days, then we could proceed to bring him to the operating room for an outpatient diagnostic laparoscopy and appendectomy to at least eliminate that from the variables.  The patient is in agreement with this he will follow-up with me this Monday, 05/13/2022.  Will give him a prescription for Augmentin for 10-day course. - Return precautions given.   Howie Ill, MD [facility] Pg:  [phone]

## 2022-05-09 NOTE — ED Notes (Signed)
See triage note  Presents with some general abd discomfort  with some nausea  Also states he noticed some blood in stools this am   Was seen for post concussion sx's a few days ago

## 2022-05-09 NOTE — ED Notes (Signed)
Patient transported to CT 

## 2022-05-09 NOTE — ED Triage Notes (Addendum)
Pt to ED via POV for abdominal pain and blood in stool. Pt states that this morning he has had 2 episodes of blood in his stool. Pt states that his bowel movement was painful this morning which is not normal for him. Pt States that he is having nausea but no vomiting. Pt states that he does not have an appetite. Pt is also c/o dizziness. Pt is currently in NAD.

## 2022-05-13 ENCOUNTER — Ambulatory Visit (INDEPENDENT_AMBULATORY_CARE_PROVIDER_SITE_OTHER): Payer: Self-pay | Admitting: Surgery

## 2022-05-13 ENCOUNTER — Telehealth: Payer: Self-pay | Admitting: Surgery

## 2022-05-13 ENCOUNTER — Encounter: Payer: Self-pay | Admitting: Surgery

## 2022-05-13 VITALS — BP 120/73 | HR 74 | Temp 97.8°F | Ht 75.5 in | Wt 202.0 lb

## 2022-05-13 DIAGNOSIS — R1030 Lower abdominal pain, unspecified: Secondary | ICD-10-CM

## 2022-05-13 DIAGNOSIS — R1084 Generalized abdominal pain: Secondary | ICD-10-CM

## 2022-05-13 DIAGNOSIS — R110 Nausea: Secondary | ICD-10-CM

## 2022-05-13 DIAGNOSIS — R197 Diarrhea, unspecified: Secondary | ICD-10-CM

## 2022-05-13 MED ORDER — CHLORHEXIDINE GLUCONATE CLOTH 2 % EX PADS: 6.0000 | MEDICATED_PAD | Freq: Once | CUTANEOUS | Status: DC

## 2022-05-13 MED ORDER — ACETAMINOPHEN 500 MG PO TABS: 1000.0000 mg | ORAL_TABLET | ORAL | Status: AC

## 2022-05-13 MED ORDER — BUPIVACAINE LIPOSOME 1.3 % IJ SUSP: 20.0000 mL | Freq: Once | INTRAMUSCULAR | Status: DC

## 2022-05-13 MED ORDER — LACTATED RINGERS IV SOLN: INTRAVENOUS | Status: DC

## 2022-05-13 MED ORDER — CHLORHEXIDINE GLUCONATE CLOTH 2 % EX PADS
6.0000 | MEDICATED_PAD | Freq: Once | CUTANEOUS | Status: AC
  Administered 2022-05-14: 6 via TOPICAL

## 2022-05-13 MED ORDER — GABAPENTIN 300 MG PO CAPS: 300.0000 mg | ORAL_CAPSULE | ORAL | Status: AC

## 2022-05-13 MED ORDER — ORAL CARE MOUTH RINSE: 15.0000 mL | Freq: Once | OROMUCOSAL | Status: AC

## 2022-05-13 MED ORDER — SODIUM CHLORIDE 0.9 % IV SOLN
2.0000 g | INTRAVENOUS | Status: AC
  Administered 2022-05-14: 2 g via INTRAVENOUS

## 2022-05-13 MED ORDER — CHLORHEXIDINE GLUCONATE 0.12 % MT SOLN: 15.0000 mL | Freq: Once | OROMUCOSAL | Status: AC

## 2022-05-13 NOTE — Progress Notes (Signed)
05/13/2022  History of Present Illness: Jimmy Hale is a 31 y.o. male presenting for follow up of abdominal pain.  He was seen in the ED on 8/3 and 8/10 with abdominal pain.  On the initial visit, he was having lower abdominal and RLQ abdominal pain, with some stool incontinence and diarrhea.  Labs were normal and CT scan only showed an appendix measuring 8 mm with some wall thickness, but no periappendiceal stranding.  He presented again on 8/10 with now some more diffuse pain symptoms, with pain mostly in the lower abdomen, also in the right side of abdomen, but also on the left side, though less significant.  Labs again were normal and CT scan again only showed an 8 mm appendix, with no other inflammatory changes.  As a precaution, was given a prescription for Augmentin and close follow up with me was arranged.  The patient reports that the pain remains diffuse and he also continues having intermittent nausea.  He has been taking the Augmentin as instructed and is also taking Percocet for pain and Zofran for nausea.  The pain has not improved.  Still remains more intense in the lower abdomen, with discomfort in the right lower quadrant and also upper quadrants.    Past Medical History: History reviewed. No pertinent past medical history.   Past Surgical History: Past Surgical History:  Procedure Laterality Date   NO PAST SURGERIES      Home Medications: Prior to Admission medications   Medication Sig Start Date End Date Taking? Authorizing Provider  famotidine (PEPCID) 20 MG tablet Take 1 tablet (20 mg total) by mouth 2 (two) times daily. 05/02/22  Yes Stafford, Phillip, MD  loperamide (IMODIUM A-D) 2 MG tablet Take 2 tablets (4 mg total) by mouth 4 (four) times daily as needed for diarrhea or loose stools. 05/02/22  Yes Stafford, Phillip, MD  naproxen (NAPROSYN) 500 MG tablet Take 1 tablet (500 mg total) by mouth 2 (two) times daily with a meal. 05/02/22  Yes Stafford, Phillip, MD  ondansetron  (ZOFRAN-ODT) 4 MG disintegrating tablet Take 1 tablet (4 mg total) by mouth every 8 (eight) hours as needed for nausea or vomiting. 05/09/22  Yes Jessup, Charles, MD  oxyCODONE-acetaminophen (PERCOCET) 5-325 MG tablet Take 1 tablet by mouth every 4 (four) hours as needed for severe pain. 05/09/22 05/09/23 Yes Jessup, Charles, MD  albuterol (VENTOLIN HFA) 108 (90 Base) MCG/ACT inhaler Inhale 1-2 puffs into the lungs every 4 (four) hours as needed for up to 10 days for wheezing or shortness of breath. 08/21/20 08/31/20  Eaves, Lesley B, PA-C    Allergies: No Known Allergies  Review of Systems: Review of Systems  Constitutional:  Negative for chills and fever.  HENT:  Negative for hearing loss.   Respiratory:  Negative for shortness of breath.   Cardiovascular:  Negative for chest pain.  Gastrointestinal:  Positive for abdominal pain, diarrhea and nausea. Negative for constipation and vomiting.  Genitourinary:  Negative for dysuria.  Musculoskeletal:  Negative for myalgias.  Skin:  Negative for rash.  Neurological:  Negative for dizziness.  Psychiatric/Behavioral:  Negative for depression.     Physical Exam BP 120/73   Pulse 74   Temp 97.8 F (36.6 C)   Ht 6' 3.5" (1.918 m)   Wt 202 lb (91.6 kg)   SpO2 97%   BMI 24.91 kg/m  CONSTITUTIONAL: No acute distress, well nourished. HEENT:  Normocephalic, atraumatic, extraocular motion intact. NECK:  Trachea is midline, no jugular venous distention.   RESPIRATORY:  Lungs are clear, and breath sounds are equal bilaterally. Normal respiratory effort without pathologic use of accessory muscles. CARDIOVASCULAR: Heart is regular without murmurs, gallops, or rubs. GI: The abdomen is soft, non-distended, with tenderness to palpation mostly in the lower abdomen, followed by right lower quadrant, then both upper quadrants.  Less pain in the left lower quadrant compared to other areas.  No peritonitis.  MUSCULOSKELETAL:  Normal gait, no peripheral  edema. NEUROLOGIC:  Motor and sensation is grossly normal.  Cranial nerves are grossly intact. PSYCH:  Alert and oriented to person, place and time. Affect is normal.  Labs/Imaging: Labs from 05/09/22: Na 140, K 3.8, Cl 108, CO2 25, BUN 19, Cr 1.32.  WBC 6.8, Hgb 16.1, Hct 47.1, Plt 261.  CT scan abdomen/pelvis 05/09/22: IMPRESSION: Stable appearance of the appendix which has a maximum measured diameter of 8 mm. There is no surrounding inflammation, but the appendiceal wall remains mildly thickened and hyperemic. Given the lack of change, it is felt that acute appendicitis is unlikely, but continued clinical follow-up is recommended. No other definite abnormality is noted in the abdomen.  CT scam abdomen/pelvis 05/02/22: IMPRESSION: 1. The appendix measures 8 mm diameter. Although there is no substantial periappendiceal edema or inflammation, the wall of the appendix appears mildly thickened and hyperemic. Close correlation for signs/symptoms of acute appendicitis recommended. 2. Otherwise unremarkable exam.  Assessment and Plan: This is a 31 y.o. male with diffuse abdominal pain  --Discussed with the patient again about the findings on his CT scan and reviewed the most recent CT scan images with him.  The appendix measures 8 mm with mildly thickened wall.  However, over two CT scans spread a week apart, there are no changes, no inflammatory changes, no other evidence of issues in his abdomen.  It is not clear that this could be appendicitis, but there are no other findings that are concerning on his CT scan.  There's no small bowel or colon inflammation that could represent IBD.  In light of this, discussed with the patient that we could proceed with a laparoscopic appendectomy, with also a diagnostic laparoscopy to take pictures of his small bowel and pelvis areas to make sure we're not missing anything else.  Unfortunately, cannot guarantee him that doing an appendectomy will resolve his  pain.  The patient understands and wishes to proceed with surgery. --Reviewed then the surgical plan for a diagnostic laparoscopy with appendectomy and discussed the surgery at length including the incisions, the risks of bleeding, infection, injury to surrounding structures, the possibility of a negative appendectomy, failure to resolve symptoms, post-op pain, post-op activity restrictions, and he's willing to proceed. --Will schedule him for tomorrow 05/14/22.  All of his questions have been answered.  I spent 40 minutes dedicated to the care of this patient on the date of this encounter to include pre-visit review of records, face-to-face time with the patient discussing diagnosis and management, and any post-visit coordination of care.   Howie Ill, MD [facility]

## 2022-05-13 NOTE — H&P (View-Only) (Signed)
05/13/2022  History of Present Illness: Jimmy Hale is a 31 y.o. male presenting for follow up of abdominal pain.  He was seen in the ED on 8/3 and 8/10 with abdominal pain.  On the initial visit, he was having lower abdominal and RLQ abdominal pain, with some stool incontinence and diarrhea.  Labs were normal and CT scan only showed an appendix measuring 8 mm with some wall thickness, but no periappendiceal stranding.  He presented again on 8/10 with now some more diffuse pain symptoms, with pain mostly in the lower abdomen, also in the right side of abdomen, but also on the left side, though less significant.  Labs again were normal and CT scan again only showed an 8 mm appendix, with no other inflammatory changes.  As a precaution, was given a prescription for Augmentin and close follow up with me was arranged.  The patient reports that the pain remains diffuse and he also continues having intermittent nausea.  He has been taking the Augmentin as instructed and is also taking Percocet for pain and Zofran for nausea.  The pain has not improved.  Still remains more intense in the lower abdomen, with discomfort in the right lower quadrant and also upper quadrants.    Past Medical History: History reviewed. No pertinent past medical history.   Past Surgical History: Past Surgical History:  Procedure Laterality Date   NO PAST SURGERIES      Home Medications: Prior to Admission medications   Medication Sig Start Date End Date Taking? Authorizing Provider  famotidine (PEPCID) 20 MG tablet Take 1 tablet (20 mg total) by mouth 2 (two) times daily. 05/02/22  Yes Sharman Cheek, MD  loperamide (IMODIUM A-D) 2 MG tablet Take 2 tablets (4 mg total) by mouth 4 (four) times daily as needed for diarrhea or loose stools. 05/02/22  Yes Sharman Cheek, MD  naproxen (NAPROSYN) 500 MG tablet Take 1 tablet (500 mg total) by mouth 2 (two) times daily with a meal. 05/02/22  Yes Sharman Cheek, MD  ondansetron  (ZOFRAN-ODT) 4 MG disintegrating tablet Take 1 tablet (4 mg total) by mouth every 8 (eight) hours as needed for nausea or vomiting. 05/09/22  Yes Chesley Noon, MD  oxyCODONE-acetaminophen (PERCOCET) 5-325 MG tablet Take 1 tablet by mouth every 4 (four) hours as needed for severe pain. 05/09/22 05/09/23 Yes Chesley Noon, MD  albuterol (VENTOLIN HFA) 108 (90 Base) MCG/ACT inhaler Inhale 1-2 puffs into the lungs every 4 (four) hours as needed for up to 10 days for wheezing or shortness of breath. 08/21/20 08/31/20  Shirlee Latch, PA-C    Allergies: No Known Allergies  Review of Systems: Review of Systems  Constitutional:  Negative for chills and fever.  HENT:  Negative for hearing loss.   Respiratory:  Negative for shortness of breath.   Cardiovascular:  Negative for chest pain.  Gastrointestinal:  Positive for abdominal pain, diarrhea and nausea. Negative for constipation and vomiting.  Genitourinary:  Negative for dysuria.  Musculoskeletal:  Negative for myalgias.  Skin:  Negative for rash.  Neurological:  Negative for dizziness.  Psychiatric/Behavioral:  Negative for depression.     Physical Exam BP 120/73   Pulse 74   Temp 97.8 F (36.6 C)   Ht 6' 3.5" (1.918 m)   Wt 202 lb (91.6 kg)   SpO2 97%   BMI 24.91 kg/m  CONSTITUTIONAL: No acute distress, well nourished. HEENT:  Normocephalic, atraumatic, extraocular motion intact. NECK:  Trachea is midline, no jugular venous distention.  RESPIRATORY:  Lungs are clear, and breath sounds are equal bilaterally. Normal respiratory effort without pathologic use of accessory muscles. CARDIOVASCULAR: Heart is regular without murmurs, gallops, or rubs. GI: The abdomen is soft, non-distended, with tenderness to palpation mostly in the lower abdomen, followed by right lower quadrant, then both upper quadrants.  Less pain in the left lower quadrant compared to other areas.  No peritonitis.  MUSCULOSKELETAL:  Normal gait, no peripheral  edema. NEUROLOGIC:  Motor and sensation is grossly normal.  Cranial nerves are grossly intact. PSYCH:  Alert and oriented to person, place and time. Affect is normal.  Labs/Imaging: Labs from 05/09/22: Na 140, K 3.8, Cl 108, CO2 25, BUN 19, Cr 1.32.  WBC 6.8, Hgb 16.1, Hct 47.1, Plt 261.  CT scan abdomen/pelvis 05/09/22: IMPRESSION: Stable appearance of the appendix which has a maximum measured diameter of 8 mm. There is no surrounding inflammation, but the appendiceal wall remains mildly thickened and hyperemic. Given the lack of change, it is felt that acute appendicitis is unlikely, but continued clinical follow-up is recommended. No other definite abnormality is noted in the abdomen.  CT scam abdomen/pelvis 05/02/22: IMPRESSION: 1. The appendix measures 8 mm diameter. Although there is no substantial periappendiceal edema or inflammation, the wall of the appendix appears mildly thickened and hyperemic. Close correlation for signs/symptoms of acute appendicitis recommended. 2. Otherwise unremarkable exam.  Assessment and Plan: This is a 31 y.o. male with diffuse abdominal pain  --Discussed with the patient again about the findings on his CT scan and reviewed the most recent CT scan images with him.  The appendix measures 8 mm with mildly thickened wall.  However, over two CT scans spread a week apart, there are no changes, no inflammatory changes, no other evidence of issues in his abdomen.  It is not clear that this could be appendicitis, but there are no other findings that are concerning on his CT scan.  There's no small bowel or colon inflammation that could represent IBD.  In light of this, discussed with the patient that we could proceed with a laparoscopic appendectomy, with also a diagnostic laparoscopy to take pictures of his small bowel and pelvis areas to make sure we're not missing anything else.  Unfortunately, cannot guarantee him that doing an appendectomy will resolve his  pain.  The patient understands and wishes to proceed with surgery. --Reviewed then the surgical plan for a diagnostic laparoscopy with appendectomy and discussed the surgery at length including the incisions, the risks of bleeding, infection, injury to surrounding structures, the possibility of a negative appendectomy, failure to resolve symptoms, post-op pain, post-op activity restrictions, and he's willing to proceed. --Will schedule him for tomorrow 05/14/22.  All of his questions have been answered.  I spent 40 minutes dedicated to the care of this patient on the date of this encounter to include pre-visit review of records, face-to-face time with the patient discussing diagnosis and management, and any post-visit coordination of care.   Howie Ill, MD [facility]

## 2022-05-13 NOTE — Patient Instructions (Addendum)
We have scheduled you for an elective Appendectomy. Please see the following information regarding your surgery.  Your surgery is scheduled for tomorrow 05/14/22. Do not eat any food after midnight. You may drink water, apple juice(no pulp), Gatorade, or black coffee or tea. Stop all fluids 2 hours before you are to arrive at the hospital.   Typically, our patients are out of work 1-2 weeks following the surgery and may return with a lifting restriction of no more than 15 lbs. For a complete 6 weeks following surgery.   If you have FMLA or Disability paperwork that needs to be filled out, please have your company fax your paperwork to [phone] or you may drop this by either office. This paperwork will be filled out within 3 days after your surgery has been completed.  Also, please review the [facility]) Pre-Care sheet for further information regarding your surgery. Our surgery schedule will call you to verify surgery date and to go over information.  If you have any questions, please do not hesitate to contact our office.  Laparoscopic Appendectomy, Adult, Care After Refer to this sheet in the next few weeks. These instructions provide you with information about caring for yourself after your procedure. Your health care provider may also give you more specific instructions. Your treatment has been planned according to current medical practices, but problems sometimes occur. Call your health care provider if you have any problems or questions after your procedure. What can I expect after the procedure? After the procedure, it is common to have: A decrease in your energy level. Mild pain in the area where the surgical cuts (incisions) were made. Constipation. This can be caused by pain medicine and a decrease in your activity.  Follow these instructions at home: Medicines Take over-the-counter and prescription medicines only as told by your health care provider. Do not drive for 24 hours if you  received a sedative. Do not drive or operate heavy machinery while taking prescription pain medicine. If you were prescribed an antibiotic medicine, take it as told by your health care provider. Do not stop taking the antibiotic even if you start to feel better. Activity For 3 weeks or as long as told by your health care provider: Do not lift anything that is heavier than 10 pounds (4.5 kg). Do not play contact sports. Gradually return to your normal activities. Ask your health care provider what activities are safe for you. Bathing Keep your incisions clean and dry. Clean them as often as told by your health care provider: Gently wash the incisions with soap and water. Rinse the incisions with water to remove all soap. Pat the incisions dry with a clean towel. Do not rub the incisions. You may take showers after 48 hours. Do not take baths, swim, or use hot tubs for 2 weeks or as told by your health care provider. Incision care Follow instructions from your healthcare provider about how to take care of your incisions. Make sure you: Wash your hands with soap and water before you change your bandage (dressing). If soap and water are not available, use hand sanitizer. Change your dressing as told by your health care provider. Leave stitches (sutures), skin glue, or adhesive strips in place. These skin closures may need to stay in place for 2 weeks or longer. If adhesive strip edges start to loosen and curl up, you may trim the loose edges. Do not remove adhesive strips completely unless your health care provider tells you to do  that. Check your incision areas every day for signs of infection. Check for: More redness, swelling, or pain. More fluid or blood. Warmth. Pus or a bad smell. Other Instructions If you were sent home with a drain, follow instructions from your health care provider about how to care for the drain and how to empty it. Take deep breaths. This helps to prevent your lungs  from becoming inflamed. To relieve and prevent constipation: Drink plenty of fluids. Eat plenty of fruits and vegetables. Keep all follow-up visits as told by your health care provider. This is important. Contact a health care provider if: You have more redness, swelling, or pain around an incision. You have more fluid or blood coming from an incision. Your incision feels warm to the touch. You have pus or a bad smell coming from an incision or dressing. Your incision edges break open after your sutures have been removed. You have increasing pain in your shoulders. You feel dizzy or you faint. You develop shortness of breath. You keep feeling nauseous or vomiting. You have diarrhea or you cannot control your bowel functions. You lose your appetite. You develop swelling or pain in your legs. Get help right away if: You have a fever. You develop a rash. You have difficulty breathing. You have sharp pains in your chest. This information is not intended to replace advice given to you by your health care provider. Make sure you discuss any questions you have with your health care provider. Document Released: 09/16/2005 Document Revised: 02/16/2016 Document Reviewed: 03/06/2015 Elsevier Interactive Patient Education  2017 [employer].

## 2022-05-13 NOTE — Telephone Encounter (Signed)
Patient has been advised of Pre-Admission date/time, and Surgery date.  Surgery Date: 05/14/22 Preadmission Testing Date: 05/14/22 (phone 2 hours early   Patient has been made aware to call [phone], between 1-3:00pm the day before surgery, to find out what time to arrive for surgery.

## 2022-05-14 ENCOUNTER — Other Ambulatory Visit: Payer: Self-pay

## 2022-05-14 ENCOUNTER — Ambulatory Visit: Payer: Self-pay | Admitting: Anesthesiology

## 2022-05-14 ENCOUNTER — Ambulatory Visit
Admission: RE | Admit: 2022-05-14 | Discharge: 2022-05-14 | Disposition: A | Discharge status: Home / Self Care | Payer: Self-pay | Attending: Surgery | Admitting: Surgery

## 2022-05-14 ENCOUNTER — Encounter: Payer: Self-pay | Admitting: Surgery

## 2022-05-14 ENCOUNTER — Encounter
Admission: RE | Disposition: A | Discharge status: Home / Self Care | Payer: Self-pay | Source: Home / Self Care | Attending: Surgery

## 2022-05-14 DIAGNOSIS — R1030 Lower abdominal pain, unspecified: Secondary | ICD-10-CM

## 2022-05-14 DIAGNOSIS — K219 Gastro-esophageal reflux disease without esophagitis: Secondary | ICD-10-CM | POA: Insufficient documentation

## 2022-05-14 DIAGNOSIS — Z87820 Personal history of traumatic brain injury: Secondary | ICD-10-CM | POA: Insufficient documentation

## 2022-05-14 DIAGNOSIS — K429 Umbilical hernia without obstruction or gangrene: Secondary | ICD-10-CM | POA: Insufficient documentation

## 2022-05-14 DIAGNOSIS — F4310 Post-traumatic stress disorder, unspecified: Secondary | ICD-10-CM | POA: Insufficient documentation

## 2022-05-14 DIAGNOSIS — F17290 Nicotine dependence, other tobacco product, uncomplicated: Secondary | ICD-10-CM | POA: Insufficient documentation

## 2022-05-14 DIAGNOSIS — K3580 Unspecified acute appendicitis: Secondary | ICD-10-CM

## 2022-05-14 HISTORY — PX: LAPAROSCOPIC APPENDECTOMY: SHX408

## 2022-05-14 HISTORY — PX: UMBILICAL HERNIA REPAIR: SHX196

## 2022-05-14 SURGERY — APPENDECTOMY, LAPAROSCOPIC
Anesthesia: General

## 2022-05-14 MED ORDER — FENTANYL CITRATE (PF) 100 MCG/2ML IJ SOLN
INTRAMUSCULAR | Status: DC | PRN
  Administered 2022-05-14 (×2): 50 ug via INTRAVENOUS

## 2022-05-14 MED ORDER — ROCURONIUM BROMIDE 100 MG/10ML IV SOLN
INTRAVENOUS | Status: DC | PRN
  Administered 2022-05-14: 50 mg via INTRAVENOUS

## 2022-05-14 MED ORDER — BUPIVACAINE-EPINEPHRINE (PF) 0.5% -1:200000 IJ SOLN
INTRAMUSCULAR | Status: DC | PRN
  Administered 2022-05-14: 30 mL via PERINEURAL

## 2022-05-14 MED ORDER — DEXAMETHASONE SODIUM PHOSPHATE 10 MG/ML IJ SOLN
INTRAMUSCULAR | Status: DC | PRN
  Administered 2022-05-14: 10 mg via INTRAVENOUS

## 2022-05-14 MED ORDER — ACETAMINOPHEN 500 MG PO TABS: 1000.0000 mg | ORAL_TABLET | Freq: Four times a day (QID) | ORAL | Status: AC | PRN

## 2022-05-14 MED ORDER — SODIUM CHLORIDE 0.9 % IV SOLN
INTRAVENOUS | Status: AC
  Filled 2022-05-14: qty 2

## 2022-05-14 MED ORDER — OXYCODONE HCL 5 MG PO TABS: 5.0000 mg | ORAL_TABLET | ORAL | 0 refills | Status: AC | PRN

## 2022-05-14 MED ORDER — FENTANYL CITRATE (PF) 100 MCG/2ML IJ SOLN: 25.0000 ug | INTRAMUSCULAR | Status: DC | PRN

## 2022-05-14 MED ORDER — PHENYLEPHRINE 80 MCG/ML (10ML) SYRINGE FOR IV PUSH (FOR BLOOD PRESSURE SUPPORT)
PREFILLED_SYRINGE | INTRAVENOUS | Status: DC | PRN
  Administered 2022-05-14 (×3): 80 ug via INTRAVENOUS
  Administered 2022-05-14: 40 ug via INTRAVENOUS

## 2022-05-14 MED ORDER — PHENYLEPHRINE 80 MCG/ML (10ML) SYRINGE FOR IV PUSH (FOR BLOOD PRESSURE SUPPORT)
PREFILLED_SYRINGE | INTRAVENOUS | Status: AC
  Filled 2022-05-14: qty 10

## 2022-05-14 MED ORDER — CHLORHEXIDINE GLUCONATE 0.12 % MT SOLN
OROMUCOSAL | Status: AC
  Administered 2022-05-14: 15 mL via OROMUCOSAL
  Filled 2022-05-14: qty 15

## 2022-05-14 MED ORDER — LIDOCAINE HCL (CARDIAC) PF 100 MG/5ML IV SOSY
PREFILLED_SYRINGE | INTRAVENOUS | Status: DC | PRN
  Administered 2022-05-14: 100 mg via INTRAVENOUS

## 2022-05-14 MED ORDER — BUPIVACAINE-EPINEPHRINE (PF) 0.5% -1:200000 IJ SOLN
INTRAMUSCULAR | Status: AC
  Filled 2022-05-14: qty 30

## 2022-05-14 MED ORDER — LACTATED RINGERS IV SOLN: INTRAVENOUS | Status: DC

## 2022-05-14 MED ORDER — SUGAMMADEX SODIUM 200 MG/2ML IV SOLN
INTRAVENOUS | Status: DC | PRN
  Administered 2022-05-14: 200 mg via INTRAVENOUS

## 2022-05-14 MED ORDER — ONDANSETRON HCL 4 MG/2ML IJ SOLN: 4.0000 mg | Freq: Once | INTRAMUSCULAR | Status: DC | PRN

## 2022-05-14 MED ORDER — ACETAMINOPHEN 500 MG PO TABS
ORAL_TABLET | ORAL | Status: AC
  Administered 2022-05-14: 1000 mg via ORAL
  Filled 2022-05-14: qty 2

## 2022-05-14 MED ORDER — OXYCODONE HCL 5 MG PO TABS
5.0000 mg | ORAL_TABLET | Freq: Once | ORAL | Status: AC | PRN
  Administered 2022-05-14: 5 mg via ORAL

## 2022-05-14 MED ORDER — MIDAZOLAM HCL 2 MG/2ML IJ SOLN
INTRAMUSCULAR | Status: AC
  Filled 2022-05-14: qty 2

## 2022-05-14 MED ORDER — OXYCODONE HCL 5 MG/5ML PO SOLN: 5.0000 mg | Freq: Once | ORAL | Status: AC | PRN

## 2022-05-14 MED ORDER — IBUPROFEN 800 MG PO TABS: 800.0000 mg | ORAL_TABLET | Freq: Three times a day (TID) | ORAL | 1 refills | Status: AC | PRN

## 2022-05-14 MED ORDER — GABAPENTIN 300 MG PO CAPS
ORAL_CAPSULE | ORAL | Status: AC
  Administered 2022-05-14: 300 mg via ORAL
  Filled 2022-05-14: qty 1

## 2022-05-14 MED ORDER — KETOROLAC TROMETHAMINE 30 MG/ML IJ SOLN
INTRAMUSCULAR | Status: DC | PRN
  Administered 2022-05-14: 30 mg via INTRAVENOUS

## 2022-05-14 MED ORDER — FENTANYL CITRATE (PF) 100 MCG/2ML IJ SOLN
INTRAMUSCULAR | Status: AC
  Filled 2022-05-14: qty 2

## 2022-05-14 MED ORDER — PROPOFOL 10 MG/ML IV BOLUS
INTRAVENOUS | Status: DC | PRN
  Administered 2022-05-14: 200 mg via INTRAVENOUS

## 2022-05-14 MED ORDER — MIDAZOLAM HCL 2 MG/2ML IJ SOLN
INTRAMUSCULAR | Status: DC | PRN
  Administered 2022-05-14: 2 mg via INTRAVENOUS

## 2022-05-14 MED ORDER — PROPOFOL 10 MG/ML IV BOLUS
INTRAVENOUS | Status: AC
  Filled 2022-05-14: qty 20

## 2022-05-14 MED ORDER — 0.9 % SODIUM CHLORIDE (POUR BTL) OPTIME
TOPICAL | Status: DC | PRN
  Administered 2022-05-14: 200 mL

## 2022-05-14 MED ORDER — DEXMEDETOMIDINE (PRECEDEX) IN NS 20 MCG/5ML (4 MCG/ML) IV SYRINGE
PREFILLED_SYRINGE | INTRAVENOUS | Status: DC | PRN
  Administered 2022-05-14 (×5): 4 ug via INTRAVENOUS

## 2022-05-14 MED ORDER — OXYCODONE HCL 5 MG PO TABS
ORAL_TABLET | ORAL | Status: AC
  Filled 2022-05-14: qty 1

## 2022-05-14 MED ORDER — ACETAMINOPHEN 10 MG/ML IV SOLN: 1000.0000 mg | Freq: Once | INTRAVENOUS | Status: DC | PRN

## 2022-05-14 MED ORDER — ONDANSETRON HCL 4 MG/2ML IJ SOLN
INTRAMUSCULAR | Status: DC | PRN
  Administered 2022-05-14: 4 mg via INTRAVENOUS

## 2022-05-14 SURGICAL SUPPLY — 43 items
CUTTER FLEX LINEAR 45M (STAPLE) IMPLANT
DERMABOND ADVANCED (GAUZE/BANDAGES/DRESSINGS) ×1
DERMABOND ADVANCED .7 DNX12 (GAUZE/BANDAGES/DRESSINGS) ×1 IMPLANT
ELECT CAUTERY BLADE TIP 2.5 (TIP) ×2
ELECT REM PT RETURN 9FT ADLT (ELECTROSURGICAL) ×2
ELECTRODE CAUTERY BLDE TIP 2.5 (TIP) ×1 IMPLANT
ELECTRODE REM PT RTRN 9FT ADLT (ELECTROSURGICAL) ×1 IMPLANT
GLOVE SURG SYN 7.0 (GLOVE) ×2 IMPLANT
GLOVE SURG SYN 7.0 PF PI (GLOVE) ×1 IMPLANT
GLOVE SURG SYN 7.5  E (GLOVE) ×1
GLOVE SURG SYN 7.5 E (GLOVE) ×1 IMPLANT
GLOVE SURG SYN 7.5 PF PI (GLOVE) ×1 IMPLANT
GOWN STRL REUS W/ TWL LRG LVL3 (GOWN DISPOSABLE) ×2 IMPLANT
GOWN STRL REUS W/TWL LRG LVL3 (GOWN DISPOSABLE) ×2
IRRIGATION STRYKERFLOW (MISCELLANEOUS) IMPLANT
IRRIGATOR STRYKERFLOW (MISCELLANEOUS)
IV NS 1000ML (IV SOLUTION) ×1
IV NS 1000ML BAXH (IV SOLUTION) ×1 IMPLANT
KIT TURNOVER KIT A (KITS) ×2 IMPLANT
LABEL OR SOLS (LABEL) ×2 IMPLANT
MANIFOLD NEPTUNE II (INSTRUMENTS) ×2 IMPLANT
NEEDLE HYPO 22GX1.5 SAFETY (NEEDLE) ×2 IMPLANT
NS IRRIG 500ML POUR BTL (IV SOLUTION) ×2 IMPLANT
PACK LAP CHOLECYSTECTOMY (MISCELLANEOUS) ×2 IMPLANT
RELOAD 45 VASCULAR/THIN (ENDOMECHANICALS) IMPLANT
RELOAD STAPLE 45 2.5 WHT GRN (ENDOMECHANICALS) IMPLANT
RELOAD STAPLE 45 3.5 BLU ETS (ENDOMECHANICALS) ×1 IMPLANT
RELOAD STAPLE TA45 3.5 REG BLU (ENDOMECHANICALS) ×4 IMPLANT
SCISSORS METZENBAUM CVD 33 (INSTRUMENTS) ×2 IMPLANT
SLEEVE ADV FIXATION 5X100MM (TROCAR) ×2 IMPLANT
SUT MNCRL 4-0 (SUTURE) ×1
SUT MNCRL 4-0 27XMFL (SUTURE) ×1
SUT VICRYL 0 AB UR-6 (SUTURE) ×2 IMPLANT
SUTURE MNCRL 4-0 27XMF (SUTURE) ×1 IMPLANT
SYS BAG RETRIEVAL 10MM (BASKET) ×2
SYS KII FIOS ACCESS ABD 5X100 (TROCAR) ×2
SYSTEM BAG RETRIEVAL 10MM (BASKET) ×1 IMPLANT
SYSTEM KII FIOS ACES ABD 5X100 (TROCAR) ×1 IMPLANT
TRAP FLUID SMOKE EVACUATOR (MISCELLANEOUS) ×2 IMPLANT
TRAY FOLEY MTR SLVR 16FR STAT (SET/KITS/TRAYS/PACK) ×2 IMPLANT
TROCAR BALLN GELPORT 12X130M (ENDOMECHANICALS) ×2 IMPLANT
TUBING EVAC SMOKE HEATED PNEUM (TUBING) ×2 IMPLANT
WATER STERILE IRR 500ML POUR (IV SOLUTION) ×2 IMPLANT

## 2022-05-14 NOTE — Transfer of Care (Signed)
Immediate Anesthesia Transfer of Care Note  Patient: Jimmy Hale  Procedure(s) Performed: APPENDECTOMY LAPAROSCOPIC HERNIA REPAIR UMBILICAL ADULT  Patient Location: PACU  Anesthesia Type:General  Level of Consciousness: drowsy  Airway & Oxygen Therapy: Patient Spontanous Breathing and Patient connected to face mask oxygen  Post-op Assessment: Report given to RN and Post -op Vital signs reviewed and stable  Post vital signs: Reviewed and stable  Last Vitals:  Vitals Value Taken Time  BP 108/69   Temp    Pulse 73 05/14/22 1816  Resp 14   SpO2 99 % 05/14/22 1816  Vitals shown include unvalidated device data.  Last Pain:  Vitals:   05/14/22 1511  PainSc: 0-No pain      Patients Stated Pain Goal: 0 (05/14/22 1511)  Complications: No notable events documented.

## 2022-05-14 NOTE — Discharge Instructions (Signed)

## 2022-05-14 NOTE — Anesthesia Procedure Notes (Signed)
Procedure Name: Intubation Date/Time: 05/14/2022 4:54 PM  Performed by: Ginger Carne, CRNAPre-anesthesia Checklist: Patient identified, Emergency Drugs available, Suction available, Patient being monitored and Timeout performed Patient Re-evaluated:Patient Re-evaluated prior to induction Oxygen Delivery Method: Circle system utilized Preoxygenation: Pre-oxygenation with 100% oxygen Induction Type: IV induction Ventilation: Mask ventilation without difficulty Laryngoscope Size: McGraph and 4 Grade View: Grade I Tube type: Oral Tube size: 7.5 mm Number of attempts: 1 Airway Equipment and Method: Stylet and Video-laryngoscopy Placement Confirmation: ETT inserted through vocal cords under direct vision, positive ETCO2 and breath sounds checked- equal and bilateral Secured at: 21 cm Tube secured with: Tape Dental Injury: Teeth and Oropharynx as per pre-operative assessment

## 2022-05-14 NOTE — Anesthesia Preprocedure Evaluation (Addendum)
Anesthesia Evaluation  Patient identified by MRN, date of birth, ID band Patient awake    Reviewed: Allergy & Precautions, NPO status , Patient's Chart, lab work & pertinent test results  History of Anesthesia Complications Negative for: history of anesthetic complications  Airway Mallampati: I   Neck ROM: Full    Dental no notable dental hx.    Pulmonary former smoker (quit 2 years ago; current vaping),    Pulmonary exam normal breath sounds clear to auscultation       Cardiovascular Exercise Tolerance: Good Normal cardiovascular exam Rhythm:Regular Rate:Normal  ECG 05/10/22: NSR, rightward axis   Neuro/Psych  Headaches, PSYCHIATRIC DISORDERS (PTSD) Concussion 05/02/22; hx TBI prior to that    GI/Hepatic GERD  ,  Endo/Other  negative endocrine ROS  Renal/GU negative Renal ROS     Musculoskeletal   Abdominal   Peds  Hematology negative hematology ROS (+)   Anesthesia Other Findings   Reproductive/Obstetrics                           Anesthesia Physical Anesthesia Plan  ASA: 2  Anesthesia Plan: General   Post-op Pain Management:    Induction: Intravenous  PONV Risk Score and Plan: 2 and Ondansetron, Dexamethasone and Treatment may vary due to age or medical condition  Airway Management Planned: Oral ETT  Additional Equipment:   Intra-op Plan:   Post-operative Plan: Extubation in OR  Informed Consent: I have reviewed the patients History and Physical, chart, labs and discussed the procedure including the risks, benefits and alternatives for the proposed anesthesia with the patient or authorized representative who has indicated his/her understanding and acceptance.     Dental advisory given  Plan Discussed with: CRNA  Anesthesia Plan Comments: (Patient consented for risks of anesthesia including but not limited to:  - adverse reactions to medications - damage to eyes, teeth,  lips or other oral mucosa - nerve damage due to positioning  - sore throat or hoarseness - damage to heart, brain, nerves, lungs, other parts of body or loss of life  Informed patient about role of CRNA in peri- and intra-operative care.  Patient voiced understanding.)      Anesthesia Quick Evaluation

## 2022-05-14 NOTE — Op Note (Addendum)
  Procedure Date:  05/14/2022  Pre-operative Diagnosis:  Abdominal pain  Post-operative Diagnosis: Appendicitis, umbilical hernia (1 cm, reducible)  Procedure:  Diagnostic laparoscopy with Laparoscopic appendectomy; open umbilical hernia repair  Surgeon:  Howie Ill, MD  Anesthesia:  General endotracheal  Estimated Blood Loss:  5 ml  Specimens:  appendix  Complications:  None  Indications for Procedure:  This is a 31 y.o. male who presents with abdominal pain and workup with unclear etiology, only showing a thickened appendix in two CT scans spanning 7 days without any inflammatory changes.  Conservative measures were attempted with oral antibiotics, but there was no improvement in the pain.  The options of surgery versus observation were reviewed with the patient and/or family. The risks of bleeding, infection, recurrence of symptoms, negative laparoscopy, potential for an open procedure, bowel injury, abscess or infection, were all discussed with the patient and he was willing to proceed.  Description of Procedure: The patient was correctly identified in the preoperative area and brought into the operating room.  The patient was placed supine with VTE prophylaxis in place.  Appropriate time-outs were performed.  Anesthesia was induced and the patient was intubated.  Foley catheter was placed.  Appropriate antibiotics were infused.  The abdomen was prepped and draped in a sterile fashion. A supraumbilical incision was made. The patient was noted to have an umbilical hernia.  Cautery was used to dissect along the umbilical stalk and the stalk was separated off the underlying fascia, revealing a 1 cm hernia defect.  Cautery was used to free up the fascial edges, and a Hasson trocar was inserted.  Pneumoperitoneum was obtained with appropriate opening pressures.  Two 5-mm ports were placed in the suprapubic and left lateral positions under direct visualization.  The abdomen was inspected  in all quadrants and the pelvis.  The appendix was found in retrocecal position consistent with imaging.  It was hyperemic at the distal half, and there was potentially a small mass at mid body.  The appendix was carefully dissected.  The mesoappendix was divided using the LigaSure.  The base of the appendix was dissected out and divided with a standard load Endo GIA.  The appendix was placed in an Endocatch bag.  Cautery was used to cauterize mild oozing from staple line.  The right lower quadrant was then inspected again revealing an intact staple line, no bleeding, and no bowel injury.  The 5 mm ports were removed under direct visualization and the Hasson trocar was removed.  The Endocatch bag was brought out through the umbilical incision.  The hernia defect was closed with 0 Vicryl sutures.  The stalk was reattached using 2-0 Vicryl.  Local anesthetic was infused in all incisions.  The umbilical incision was closed in layers using 3-0 Vicryl and 4-0 Monocryl,  and the other port incisions were closed with 4-0 Monocryl.  The wounds were cleaned and sealed with DermaBond.  Foley catheter was removed and the patient was emerged from anesthesia and extubated and brought to the recovery room for further management.  The patient tolerated the procedure well and all counts were correct at the end of the case.   Howie Ill, MD

## 2022-05-14 NOTE — Interval H&P Note (Signed)
History and Physical Interval Note:  05/14/2022 4:25 PM  Jimmy Hale  has presented today for surgery, with the diagnosis of abdominal pain, appendicitis suspected.  The various methods of treatment have been discussed with the patient and family. After consideration of risks, benefits and other options for treatment, the patient has consented to  Procedure(s): APPENDECTOMY LAPAROSCOPIC (N/A) as a surgical intervention.  The patient's history has been reviewed, patient examined, no change in status, stable for surgery.  I have reviewed the patient's chart and labs.  Questions were answered to the patient's satisfaction.     Katty Fretwell

## 2022-05-14 NOTE — Anesthesia Postprocedure Evaluation (Signed)
Anesthesia Post Note  Patient: [name]  Procedure(s) Performed: APPENDECTOMY LAPAROSCOPIC HERNIA REPAIR UMBILICAL ADULT  Patient location during evaluation: PACU Anesthesia Type: General Level of consciousness: awake and alert Pain management: pain level controlled Vital Signs Assessment: post-procedure vital signs reviewed and stable Respiratory status: spontaneous breathing, nonlabored ventilation, respiratory function stable and patient connected to nasal cannula oxygen Cardiovascular status: blood pressure returned to baseline and stable Postop Assessment: no apparent nausea or vomiting Anesthetic complications: no   No notable events documented.   Last Vitals:  Vitals:   05/14/22 1858 05/14/22 1902  BP: 104/65 100/75  Pulse: 74   Resp:    Temp:    SpO2: 98%     Last Pain:  Vitals:   05/14/22 1858  PainSc: 2                  Cleda Mccreedy Cyann Venti

## 2022-05-15 ENCOUNTER — Encounter: Payer: Self-pay | Admitting: Surgery

## 2022-05-16 LAB — SURGICAL PATHOLOGY

## 2022-05-28 ENCOUNTER — Encounter: Payer: Self-pay | Admitting: Physician Assistant

## 2022-05-28 ENCOUNTER — Ambulatory Visit (INDEPENDENT_AMBULATORY_CARE_PROVIDER_SITE_OTHER): Payer: Self-pay | Admitting: Physician Assistant

## 2022-05-28 VITALS — BP 119/82 | HR 90 | Temp 98.3°F | Ht 75.5 in | Wt 203.0 lb

## 2022-05-28 DIAGNOSIS — Z09 Encounter for follow-up examination after completed treatment for conditions other than malignant neoplasm: Secondary | ICD-10-CM

## 2022-05-28 DIAGNOSIS — K353 Acute appendicitis with localized peritonitis, without perforation or gangrene: Secondary | ICD-10-CM

## 2022-05-28 DIAGNOSIS — K3580 Unspecified acute appendicitis: Secondary | ICD-10-CM

## 2022-05-28 NOTE — Progress Notes (Signed)
[facility] POST-OP OFFICE VISIT  05/28/2022  HPI: Jimmy Hale is a 31 y.o. male 14 days s/p diagnostic laparoscopy, laparoscopic appendectomy, and umbilical hernia repair (1 cm) with Dr Aleen Campi   He is doing well Reports that the first 2-3 days were rough but pain is much more improved; not needing any pain medications Had issues with diarrhea; resolved with imodium; not needing this any more No fever, chills, nausea, emesis Incisions are healing well No other complaints   Vital signs: BP 119/82   Pulse 90   Temp 98.3 F (36.8 C)   Ht 6' 3.5" (1.918 m)   Wt 203 lb (92.1 kg)   SpO2 98%   BMI 25.04 kg/m    Physical Exam: Constitutional: Well appearing male, NAD Abdomen: Soft, non-tender, non-distended, no rebound/guarding Skin: Laparoscopic incisions are healing well, no erythema or drainage   Assessment/Plan: This is a 31 y.o. male 14 days s/p diagnostic laparoscopy, laparoscopic appendectomy, and umbilical hernia repair (1 cm) with Dr Aleen Campi    - Pain control prn  - Reviewed wound care recommendation  - Reviewed lifting restrictions; 4-6 weeks total given hernia repair   - Reviewed surgical pathology; Acute appendicitis, negative for malignancy   - He can follow up on as needed basis; He understands to call with questions/concerns  -- Lynden Oxford, PA-C [facility] 05/28/2022, 3:42 PM M-F: 7am - 4pm

## 2022-05-28 NOTE — Patient Instructions (Signed)

## 2022-12-17 ENCOUNTER — Other Ambulatory Visit: Payer: Self-pay

## 2022-12-17 ENCOUNTER — Emergency Department: Payer: Self-pay

## 2022-12-17 ENCOUNTER — Emergency Department
Admission: EM | Admit: 2022-12-17 | Discharge: 2022-12-17 | Disposition: A | Discharge status: Home / Self Care | Payer: Self-pay | Attending: Emergency Medicine | Admitting: Emergency Medicine

## 2022-12-17 DIAGNOSIS — S0990XA Unspecified injury of head, initial encounter: Secondary | ICD-10-CM | POA: Diagnosis present

## 2022-12-17 DIAGNOSIS — Y92410 Unspecified street and highway as the place of occurrence of the external cause: Secondary | ICD-10-CM | POA: Diagnosis not present

## 2022-12-17 DIAGNOSIS — S161XXA Strain of muscle, fascia and tendon at neck level, initial encounter: Secondary | ICD-10-CM

## 2022-12-17 MED ORDER — PREDNISONE 20 MG PO TABS
60.0000 mg | ORAL_TABLET | Freq: Once | ORAL | Status: AC
  Administered 2022-12-17: 60 mg via ORAL

## 2022-12-17 MED ORDER — HYDROCODONE-ACETAMINOPHEN 5-325 MG PO TABS: 1.0000 | ORAL_TABLET | Freq: Four times a day (QID) | ORAL | 0 refills | Status: AC | PRN

## 2022-12-17 MED ORDER — HYDROCODONE-ACETAMINOPHEN 5-325 MG PO TABS
ORAL_TABLET | ORAL | Status: AC
  Filled 2022-12-17: qty 1

## 2022-12-17 MED ORDER — PREDNISONE 10 MG PO TABS: 10.0000 mg | ORAL_TABLET | Freq: Every day | ORAL | 0 refills | Status: AC

## 2022-12-17 MED ORDER — PREDNISONE 20 MG PO TABS
ORAL_TABLET | ORAL | Status: AC
  Filled 2022-12-17: qty 3

## 2022-12-17 MED ORDER — HYDROCODONE-ACETAMINOPHEN 5-325 MG PO TABS
1.0000 | ORAL_TABLET | ORAL | Status: AC
  Administered 2022-12-17: 1 via ORAL

## 2022-12-17 MED ORDER — CYCLOBENZAPRINE HCL 10 MG PO TABS
ORAL_TABLET | ORAL | Status: AC
  Filled 2022-12-17: qty 1

## 2022-12-17 MED ORDER — CYCLOBENZAPRINE HCL 5 MG PO TABS: 5.0000 mg | ORAL_TABLET | Freq: Three times a day (TID) | ORAL | 0 refills | Status: AC | PRN

## 2022-12-17 MED ORDER — CYCLOBENZAPRINE HCL 10 MG PO TABS
10.0000 mg | ORAL_TABLET | Freq: Once | ORAL | Status: AC
  Administered 2022-12-17: 10 mg via ORAL
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
## 2022-12-17 NOTE — Discharge Instructions (Addendum)
Please take medications as prescribed.  You may apply ice 20 minutes every hour to areas of soreness.  After 2 to 3 days you can transition to heat.  Perform gentle stretching exercises to help prevent soreness and stiffness.  Return to the ER for any worsening symptoms or any urgent changes in your health

## 2023-02-06 ENCOUNTER — Emergency Department: Payer: Self-pay

## 2023-02-06 ENCOUNTER — Encounter: Payer: Self-pay | Admitting: Emergency Medicine

## 2023-02-06 ENCOUNTER — Emergency Department
Admission: EM | Admit: 2023-02-06 | Discharge: 2023-02-06 | Disposition: A | Discharge status: Home / Self Care | Payer: Self-pay | Attending: Emergency Medicine | Admitting: Emergency Medicine

## 2023-02-06 ENCOUNTER — Other Ambulatory Visit: Payer: Self-pay

## 2023-02-06 DIAGNOSIS — M5432 Sciatica, left side: Secondary | ICD-10-CM | POA: Insufficient documentation

## 2023-02-06 DIAGNOSIS — W108XXA Fall (on) (from) other stairs and steps, initial encounter: Secondary | ICD-10-CM | POA: Insufficient documentation

## 2023-02-06 DIAGNOSIS — W19XXXA Unspecified fall, initial encounter: Secondary | ICD-10-CM

## 2023-02-06 DIAGNOSIS — S0990XA Unspecified injury of head, initial encounter: Secondary | ICD-10-CM | POA: Insufficient documentation

## 2023-02-06 MED ORDER — KETOROLAC TROMETHAMINE 30 MG/ML IJ SOLN
30.0000 mg | Freq: Once | INTRAMUSCULAR | Status: AC
  Administered 2023-02-06: 30 mg via INTRAMUSCULAR
  Filled 2023-02-06: qty 1

## 2023-02-06 MED ORDER — LIDOCAINE 5 % EX PTCH: 1.0000 | MEDICATED_PATCH | Freq: Two times a day (BID) | CUTANEOUS | 0 refills | Status: AC

## 2023-02-06 MED ORDER — ACETAMINOPHEN 500 MG PO TABS
1000.0000 mg | ORAL_TABLET | Freq: Once | ORAL | Status: AC
  Administered 2023-02-06: 1000 mg via ORAL
  Filled 2023-02-06: qty 2

## 2023-02-06 MED ORDER — METHOCARBAMOL 500 MG PO TABS
500.0000 mg | ORAL_TABLET | Freq: Once | ORAL | Status: AC
  Administered 2023-02-06: 500 mg via ORAL
  Filled 2023-02-06: qty 1

## 2023-02-06 MED ORDER — LIDOCAINE 5 % EX PTCH
1.0000 | MEDICATED_PATCH | CUTANEOUS | Status: DC
  Administered 2023-02-06: 1 via TRANSDERMAL
  Filled 2023-02-06: qty 1
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
## 2023-02-06 NOTE — Discharge Instructions (Addendum)
Please take Tylenol and ibuprofen/Advil for your pain.  It is safe to take them together, or to alternate them every few hours.  Take up to 1000mg  of Tylenol at a time, up to 4 times per day.  Do not take more than 4000 mg of Tylenol in 24 hours.  For ibuprofen, take 400-600 mg, 3 - 4 times per day.,  Please use lidocaine patches at your site of pain.  Apply 1 patch at a time, leave on for 12 hours, then remove for 12 hours.  12 hours on, 12 hours off.  Do not apply more than 1 patch at a time.

## 2023-02-06 NOTE — ED Provider Notes (Signed)
[facility] Provider Note    Event Date/Time   First MD Initiated Contact with Patient 02/06/23 [phone]     (approximate)   History   Fall   HPI  Jimmy Hale is a 32 y.o. 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  Reports often getting night terrors.   He presents to the ED for evaluation of a fall down a few steps associated with waking up from a bad night chair.  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   Reports falling down steps and struck the left side of his back and head on the way down.  Uncertain about syncope.  Reports shooting pins-and-needles down his left posterior leg since then.  He has been ambulatory.  No saddle anesthesias.  No syncope or additional trauma/injuries.   Physical Exam   Triage Vital Signs: ED Triage Vitals [02/06/23 0443]  Enc Vitals Group     BP      Pulse      Resp      Temp      Temp src      SpO2      Weight 210 lb (95.3 kg)     Height 6\' 4"  (1.93 m)     Head Circumference      Peak Flow      Pain Score 3     Pain Loc      Pain Edu?      Excl. in GC?     Most recent vital signs: Vitals:   02/06/23 0450  BP: 127/81  Pulse: 75  Temp: 98.2 F (36.8 C)  SpO2: 96%    General: Awake, no distress.  Ambulatory.  Well-appearing. CV:  Good peripheral perfusion.  Resp:  Normal effort.  Abd:  No distention. Soft and benign MSK:  No deformity noted.  Left-sided paraspinal lumbar tenderness without overlying skin changes or signs of trauma.  No midline spinal step-offs or point tenderness. Neuro:  No focal deficits appreciated. Cranial nerves II through XII intact 5/5 strength and sensation in all 4 extremities Other:     ED Results / Procedures / Treatments   Labs (all labs ordered are listed, but only abnormal results are displayed) Labs Reviewed - No data to display  EKG Sinus rhythm with a rate of 74  bpm.  Normal axis and intervals.  No clear signs of acute ischemia.  RADIOLOGY CT head interpreted by me without evidence of acute intracranial pathology CT cervical spine interpreted by me without evidence of fracture or dislocation CXR interpreted by me without evidence of acute cardiopulmonary pathology. CT of the lumbar spine interpreted by me without evidence of fracture or dislocation.  Official radiology report(s): CT HEAD WO CONTRAST ( )  Result Date: 02/06/2023 CLINICAL DATA:  Fall down steps with head trauma. EXAM: CT HEAD WITHOUT CONTRAST CT CERVICAL SPINE WITHOUT CONTRAST TECHNIQUE: Multidetector CT imaging of the head and cervical spine was performed following the standard protocol without intravenous contrast. Multiplanar CT image reconstructions of the cervical spine were also generated. RADIATION DOSE REDUCTION: This exam was performed according to the departmental dose-optimization program which includes automated exposure control, adjustment of the mA and/or kV according to patient size and/or use of iterative reconstruction technique. COMPARISON:  12/17/2022 FINDINGS: CT HEAD FINDINGS Brain: No evidence of acute infarction, hemorrhage, hydrocephalus, extra-axial collection or mass lesion/mass effect. Vascular: No hyperdense  vessel or unexpected calcification. Skull: Normal. Negative for fracture or focal lesion. Sinuses/Orbits: No acute finding. CT CERVICAL SPINE FINDINGS Alignment: Normal. Skull base and vertebrae: No acute fracture. No primary bone lesion or focal pathologic process. Soft tissues and spinal canal: No prevertebral fluid or swelling. No visible canal hematoma. Disc levels:  No visible impingement Upper chest: No visible injury IMPRESSION: No evidence of intracranial or cervical spine injury. Electronically Signed   By: Tiburcio Pea M.D.   On: 02/06/2023 05:32   CT Cervical Spine Wo Contrast  Result Date: 02/06/2023 CLINICAL DATA:  Fall down steps with head  trauma. EXAM: CT HEAD WITHOUT CONTRAST CT CERVICAL SPINE WITHOUT CONTRAST TECHNIQUE: Multidetector CT imaging of the head and cervical spine was performed following the standard protocol without intravenous contrast. Multiplanar CT image reconstructions of the cervical spine were also generated. RADIATION DOSE REDUCTION: This exam was performed according to the departmental dose-optimization program which includes automated exposure control, adjustment of the mA and/or kV according to patient size and/or use of iterative reconstruction technique. COMPARISON:  12/17/2022 FINDINGS: CT HEAD FINDINGS Brain: No evidence of acute infarction, hemorrhage, hydrocephalus, extra-axial collection or mass lesion/mass effect. Vascular: No hyperdense vessel or unexpected calcification. Skull: Normal. Negative for fracture or focal lesion. Sinuses/Orbits: No acute finding. CT CERVICAL SPINE FINDINGS Alignment: Normal. Skull base and vertebrae: No acute fracture. No primary bone lesion or focal pathologic process. Soft tissues and spinal canal: No prevertebral fluid or swelling. No visible canal hematoma. Disc levels:  No visible impingement Upper chest: No visible injury IMPRESSION: No evidence of intracranial or cervical spine injury. Electronically Signed   By: Tiburcio Pea M.D.   On: 02/06/2023 05:32   DG Chest 2 View  Result Date: 02/06/2023 CLINICAL DATA:  Fall with back pain. EXAM: CHEST - 2 VIEW COMPARISON:  08/21/2020 FINDINGS: Normal heart size and mediastinal contours. No acute infiltrate or edema. No effusion or pneumothorax. No acute osseous findings. IMPRESSION: Normal chest. Electronically Signed   By: Tiburcio Pea M.D.   On: 02/06/2023 05:29   CT Lumbar Spine Wo Contrast  Result Date: 02/06/2023 CLINICAL DATA:  Fall with left lumbar pain.  Left sciatica EXAM: CT LUMBAR SPINE WITHOUT CONTRAST TECHNIQUE: Multidetector CT imaging of the lumbar spine was performed without intravenous contrast administration.  Multiplanar CT image reconstructions were also generated. RADIATION DOSE REDUCTION: This exam was performed according to the departmental dose-optimization program which includes automated exposure control, adjustment of the mA and/or kV according to patient size and/or use of iterative reconstruction technique. COMPARISON:  None Available. FINDINGS: Segmentation: 5 lumbar type vertebrae. Alignment: Normal. Vertebrae: No acute fracture or focal pathologic process. Paraspinal and other soft tissues: Negative. Disc levels: No visible herniation. No spurring or visible impingement. IMPRESSION: Unremarkable lumbar CT. Electronically Signed   By: Tiburcio Pea M.D.   On: 02/06/2023 05:29    PROCEDURES and INTERVENTIONS:  .1-3 Lead EKG Interpretation  Performed by: Delton Prairie, MD Authorized by: Delton Prairie, MD     Interpretation: normal     ECG rate:  70   ECG rate assessment: normal     Rhythm: sinus rhythm     Ectopy: none     Conduction: normal     Medications  lidocaine (LIDODERM) 5 % 1 patch (1 patch Transdermal Patch Applied 02/06/23 0618)  acetaminophen (TYLENOL) tablet 1,000 mg (1,000 mg Oral Given 02/06/23 0619)  ketorolac (TORADOL) 30 MG/ML injection 30 mg (30 mg Intramuscular Given 02/06/23 0619)  methocarbamol (ROBAXIN) tablet 500  mg (500 mg Oral Given 02/06/23 0618)     IMPRESSION / MDM / ASSESSMENT AND PLAN / ED COURSE  I reviewed the triage vital signs and the nursing notes.  Differential diagnosis includes, but is not limited to, cardiac syncope, seizure, muscular spasm, lumbar fracture, ICH  {Patient presents with symptoms of an acute illness or injury that is potentially life-threatening.  32 year old with PTSD presents to the ED with various pains after falling down some steps after a night terror.  He looks well to me and is ambulatory without evidence of neurologic deficits.  Have left-sided lumbar paraspinal tenderness likely precipitating sciatica.  He is uncertain about  syncope and has a reassuring EKG and no dysrhythmias on the monitor while he is observed on telemetry.  Imaging is reassuring as above.  No clear indications for serum diagnostics.  He looks well and suitable for outpatient management with nonnarcotic multimodal analgesia.  Clinical Course as of 02/06/23 0627  Thu Feb 06, 2023  1610 Reassessed.  Feeling better.  We discussed reassuring imaging and EKG.  Discussed plan of care at home [DS]    Clinical Course User Index [DS] Delton Prairie, MD     FINAL CLINICAL IMPRESSION(S) / ED DIAGNOSES   Final diagnoses:  Fall, initial encounter  Sciatica of left side  Injury of head, initial encounter     Rx / DC Orders   ED Discharge Orders          Ordered    lidocaine (LIDODERM) 5 %  Every 12 hours        02/06/23 9604             Note:  This document was prepared using Dragon voice recognition software and may include unintentional dictation errors.   Delton Prairie, MD 02/06/23 [phone]

## 2023-02-06 NOTE — ED Notes (Signed)
Patient discharged at this time. Ambulated to lobby with independent and steady gait. Breathing unlabored speaking in full sentences. Verbalized understanding of all discharge, follow up, and medication teaching. Discharged homed with all belongings.   

## 2023-08-23 ENCOUNTER — Ambulatory Visit
Admission: EM | Admit: 2023-08-23 | Discharge: 2023-08-23 | Disposition: A | Discharge status: Home / Self Care | Payer: Self-pay | Attending: Physician Assistant | Admitting: Physician Assistant

## 2023-08-23 ENCOUNTER — Emergency Department: Payer: Self-pay

## 2023-08-23 ENCOUNTER — Encounter: Payer: Self-pay | Admitting: Emergency Medicine

## 2023-08-23 ENCOUNTER — Emergency Department
Admission: EM | Admit: 2023-08-23 | Discharge: 2023-08-24 | Disposition: A | Discharge status: Home / Self Care | Payer: Self-pay | Attending: Emergency Medicine | Admitting: Emergency Medicine

## 2023-08-23 DIAGNOSIS — R202 Paresthesia of skin: Secondary | ICD-10-CM | POA: Insufficient documentation

## 2023-08-23 DIAGNOSIS — R079 Chest pain, unspecified: Secondary | ICD-10-CM

## 2023-08-23 DIAGNOSIS — R519 Headache, unspecified: Secondary | ICD-10-CM | POA: Insufficient documentation

## 2023-08-23 LAB — CBC
HCT: 43.7 % (ref 39.0–52.0)
Hemoglobin: 15.1 g/dL (ref 13.0–17.0)
MCH: 31.1 pg (ref 26.0–34.0)
MCHC: 34.6 g/dL (ref 30.0–36.0)
MCV: 90.1 fL (ref 80.0–100.0)
Platelets: 185 10*3/uL (ref 150–400)
RBC: 4.85 MIL/uL (ref 4.22–5.81)
RDW: 12.1 % (ref 11.5–15.5)
WBC: 5.7 10*3/uL (ref 4.0–10.5)
nRBC: 0 % (ref 0.0–0.2)

## 2023-08-23 LAB — TROPONIN I (HIGH SENSITIVITY)
Troponin I (High Sensitivity): <2 ng/L (ref <18)
Troponin I (High Sensitivity): <2 ng/L (ref <18)

## 2023-08-23 LAB — BASIC METABOLIC PANEL
Anion gap: 10 (ref 5–15)
BUN: 14 mg/dL (ref 6–20)
CO2: 22 mmol/L (ref 22–32)
Calcium: 9.1 mg/dL (ref 8.9–10.3)
Chloride: 105 mmol/L (ref 98–111)
Creatinine, Ser: 1.3 mg/dL — ABNORMAL HIGH (ref 0.61–1.24)
GFR, Estimated: >60 mL/min (ref >60)
Glucose, Bld: 144 mg/dL — ABNORMAL HIGH (ref 70–99)
Potassium: 3.3 mmol/L — ABNORMAL LOW (ref 3.5–5.1)
Sodium: 137 mmol/L (ref 135–145)

## 2023-08-23 LAB — MAGNESIUM: Magnesium: 2.4 mg/dL (ref 1.7–2.4)

## 2023-08-23 LAB — TSH: TSH: 2.822 u[IU]/mL (ref 0.350–4.500)

## 2023-08-23 LAB — T4, FREE: Free T4: 0.76 ng/dL (ref 0.61–1.12)

## 2023-08-23 MED ORDER — DIPHENHYDRAMINE HCL 50 MG/ML IJ SOLN
12.5000 mg | Freq: Once | INTRAMUSCULAR | Status: AC
Start: 2023-08-23 — End: 2023-08-23
  Administered 2023-08-23: 12.5 mg via INTRAVENOUS
  Filled 2023-08-23: qty 1

## 2023-08-23 MED ORDER — DROPERIDOL 2.5 MG/ML IJ SOLN
2.5000 mg | Freq: Once | INTRAMUSCULAR | Status: AC
  Administered 2023-08-23: 2.5 mg via INTRAVENOUS
  Filled 2023-08-23: qty 2

## 2023-08-23 MED ORDER — METOCLOPRAMIDE HCL 5 MG/ML IJ SOLN
10.0000 mg | Freq: Once | INTRAMUSCULAR | Status: AC
  Administered 2023-08-23: 10 mg via INTRAVENOUS
  Filled 2023-08-23: qty 2

## 2023-08-23 MED ORDER — POTASSIUM CHLORIDE CRYS ER 20 MEQ PO TBCR
40.0000 meq | EXTENDED_RELEASE_TABLET | Freq: Once | ORAL | Status: AC
  Administered 2023-08-23: 40 meq via ORAL
  Filled 2023-08-23: qty 2

## 2023-08-23 MED ORDER — SODIUM CHLORIDE 0.9 % IV BOLUS
1000.0000 mL | Freq: Once | INTRAVENOUS | Status: AC
  Administered 2023-08-23: 1000 mL via INTRAVENOUS

## 2023-08-23 MED ORDER — IOHEXOL 350 MG/ML SOLN
75.0000 mL | Freq: Once | INTRAVENOUS | Status: AC | PRN
  Administered 2023-08-23: 75 mL via INTRAVENOUS

## 2023-08-23 NOTE — ED Notes (Signed)
Patient is being discharged from the Urgent Care and sent to the Emergency Department via Personal Vehicle . Per Nehemiah Settle, [place], patient is in need of higher level of care due to Chest pain and Tremors. Patient is aware and verbalizes understanding of plan of care.  Vitals:   08/23/23 1239  BP: 121/83  Pulse: 77  Resp: 15  Temp: 98 F (36.7 C)  SpO2: 96%

## 2023-08-23 NOTE — ED Triage Notes (Signed)
Patient now reports chest pain that started 48 hours ago.

## 2023-08-23 NOTE — Discharge Instructions (Signed)

## 2023-08-23 NOTE — ED Triage Notes (Signed)
Patient states he started having tremors in his right hand about a week ago.  Patient states that the tremors have progressed to his right arm and left arm.  Patient reports heaviness feeling in his legs.  Patient reports headaches off and on.  Patient denies recent injury or fall.

## 2023-08-23 NOTE — ED Provider Notes (Signed)
[facility]    CSN: [id number] Arrival date & time: 08/23/23  1220      History   Chief Complaint Chief Complaint  Patient presents with   Tremors   Chest Pain    HPI Jimmy Hale is a 32 y.o. 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  He is reporting moderate aching centralized chest pain without radiation x 2 days and headaches.  Reports pins and needle sensation and tremors of the right arm for the past 1 week.  He says his symptoms of gotten worse.  Denies vision changes, nausea, dizziness, numbness/tingling or weakness.  Reports that his legs feel heavy.  He also reports feeling some tremors in his left arm today.  No recent injuries or loss of consciousness.  No history of cardiac problems.  When asked what makes his symptoms worse, he says his body feels "sensitive" when he takes showers because he has been taking cold showers.  Has not taken any medication to try to alleviate his symptoms.  He says he never had the sort of symptoms before and is very concerned.  HPI  History reviewed. No pertinent past medical history.  Patient Active Problem List   Diagnosis Date Noted   Acute appendicitis    Umbilical hernia without obstruction and without gangrene    Generalized abdominal pain    Right lower quadrant abdominal pain     Past Surgical History:  Procedure Laterality Date   LAPAROSCOPIC APPENDECTOMY N/A 05/14/2022   Procedure: APPENDECTOMY LAPAROSCOPIC;  Surgeon: Henrene Dodge, MD;  Location: [facility] ORS;  Service: General;  Laterality: N/A;   NO PAST SURGERIES     UMBILICAL HERNIA REPAIR N/A 05/14/2022   Procedure: HERNIA REPAIR UMBILICAL ADULT;  Surgeon: Henrene Dodge, MD;  Location: [facility];  Service: General;  Laterality: N/A;       Home Medications    Prior to Admission medications   Medication Sig Start Date End Date Taking? Authorizing Provider  acetaminophen (TYLENOL) 500 MG tablet Take 2 tablets (1,000 mg total) by mouth every 6 (six) hours as needed for  mild pain. 05/14/22   Piscoya, Elita Quick, MD  albuterol (VENTOLIN HFA) 108 (90 Base) MCG/ACT inhaler Inhale 1-2 puffs into the lungs every 4 (four) hours as needed for up to 10 days for wheezing or shortness of breath. 08/21/20 08/31/20  Eusebio Friendly B, PA-C  cyclobenzaprine (FLEXERIL) 5 MG tablet Take 1-2 tablets (5-10 mg total) by mouth 3 (three) times daily as needed for muscle spasms. 12/17/22   Evon Slack, PA-C  famotidine (PEPCID) 20 MG tablet Take 1 tablet (20 mg total) by mouth 2 (two) times daily. 05/02/22   Sharman Cheek, MD  HYDROcodone-acetaminophen (NORCO) 5-325 MG tablet Take 1 tablet by mouth every 6 (six) hours as needed for moderate pain. 12/17/22   Evon Slack, PA-C  ibuprofen (ADVIL) 800 MG tablet Take 1 tablet (800 mg total) by mouth every 8 (eight) hours as needed for moderate pain. 05/14/22   Piscoya, Elita Quick, MD  lidocaine (LIDODERM) 5 % Place 1 patch onto the skin every 12 (twelve) hours. Remove & Discard patch within 12 hours or as directed by MD 02/06/23 02/06/24  Delton Prairie, MD  loperamide (IMODIUM A-D) 2 MG tablet Take 2 tablets (4 mg total) by mouth 4 (four) times daily as needed for diarrhea or loose stools. 05/02/22   Sharman Cheek, MD  ondansetron (ZOFRAN-ODT) 4 MG disintegrating tablet Take 1 tablet (4 mg total) by mouth every 8 (eight) hours as needed  for nausea or vomiting. 05/09/22   Chesley Noon, MD  oxyCODONE (OXY IR/ROXICODONE) 5 MG immediate release tablet Take 1 tablet (5 mg total) by mouth every 4 (four) hours as needed for severe pain. 05/14/22   Henrene Dodge, MD  predniSONE (DELTASONE) 10 MG tablet Take 1 tablet (10 mg total) by mouth daily. 6,5,4,3,2,1 six day taper 12/17/22   Evon Slack, PA-C    Family History Family History  Problem Relation Age of Onset   Healthy Mother    Healthy Father     Social History Social History   Tobacco Use   Smoking status: Every Day    Types: Cigarettes    Passive exposure: Never   Smokeless tobacco:  Former    Types: Snuff  Vaping Use   Vaping status: Some Days  Substance Use Topics   Alcohol use: Yes    Comment: social   Drug use: No     Allergies   Patient has no known allergies.   Review of Systems Review of Systems  Constitutional:  Negative for fatigue.  HENT:  Negative for congestion.   Eyes:  Negative for photophobia and visual disturbance.  Respiratory:  Negative for cough and shortness of breath.   Cardiovascular:  Positive for chest pain. Negative for palpitations and leg swelling.  Gastrointestinal:  Negative for abdominal pain, nausea and vomiting.  Musculoskeletal:  Negative for back pain and gait problem.  Neurological:  Positive for tremors and headaches. Negative for dizziness, syncope, facial asymmetry, speech difficulty, weakness, light-headedness and numbness.  Psychiatric/Behavioral:  Negative for confusion.      Physical Exam Triage Vital Signs ED Triage Vitals  Encounter Vitals Group     BP 08/23/23 1239 121/83     Systolic BP Percentile --      Diastolic BP Percentile --      Pulse Rate 08/23/23 1239 77     Resp 08/23/23 1239 15     Temp 08/23/23 1239 98 F (36.7 C)     Temp Source 08/23/23 1239 Oral     SpO2 08/23/23 1239 96 %     Weight 08/23/23 1236 210 lb 1.6 oz (95.3 kg)     Height 08/23/23 1236 6\' 4"  (1.93 m)     Head Circumference --      Peak Flow --      Pain Score --      Pain Loc --      Pain Education --      Exclude from Growth Chart --    No data found.  Updated Vital Signs BP 121/83 (BP Location: Right Arm)   Pulse 77   Temp 98 F (36.7 C) (Oral)   Resp 15   Ht 6\' 4"  (1.93 m)   Wt 210 lb 1.6 oz (95.3 kg)   SpO2 96%   BMI 25.57 kg/m     Physical Exam Vitals and nursing note reviewed.  Constitutional:      General: He is not in acute distress.    Appearance: Normal appearance. He is well-developed. He is not ill-appearing.  HENT:     Head: Normocephalic and atraumatic.     Nose: Nose normal.      Mouth/Throat:     Mouth: Mucous membranes are moist.     Pharynx: Oropharynx is clear.  Eyes:     General: No scleral icterus.    Extraocular Movements: Extraocular movements intact.     Conjunctiva/sclera: Conjunctivae normal.     Pupils: Pupils are equal,  round, and reactive to light.  Cardiovascular:     Rate and Rhythm: Normal rate and regular rhythm.     Pulses: Normal pulses.     Heart sounds: Normal heart sounds.  Pulmonary:     Effort: Pulmonary effort is normal. No respiratory distress.     Breath sounds: Normal breath sounds.  Abdominal:     Palpations: Abdomen is soft.     Tenderness: There is no abdominal tenderness.  Musculoskeletal:     Cervical back: Neck supple.  Skin:    General: Skin is warm and dry.     Capillary Refill: Capillary refill takes less than 2 seconds.  Neurological:     General: No focal deficit present.     Mental Status: He is alert and oriented to person, place, and time. Mental status is at baseline.     Cranial Nerves: No cranial nerve deficit.     Motor: No weakness.     Coordination: Coordination normal.     Gait: Gait normal.     Comments: Normal nose to finger testing. When performing EKG, patient has tremors in entire right arm, but they stop with the nose to finger testing. 5/5 strength bilateral upper and lower extremities   Psychiatric:        Mood and Affect: Mood normal.        Behavior: Behavior normal.      UC Treatments / Results  Labs (all labs ordered are listed, but only abnormal results are displayed) Labs Reviewed - No data to display  EKG   Radiology No results found.  Procedures ED EKG  Date/Time: 08/23/2023 1:09 PM  Performed by: Shirlee Latch, PA-C Authorized by: Shirlee Latch, PA-C   Previous ECG:    Previous ECG:  Unavailable Interpretation:    Interpretation: normal   Rate:    ECG rate:  72 Rhythm:    Rhythm: sinus rhythm   Ectopy:    Ectopy: none   QRS:    QRS axis:  Normal   QRS  intervals:  Normal   QRS conduction: normal   ST segments:    ST segments:  Normal T waves:    T waves: normal   Comments:     Normal sinus rhythm. Regular rate.   (including critical care time)  Medications Ordered in UC Medications - No data to display  Initial Impression / Assessment and Plan / UC Course  I have reviewed the triage vital signs and the nursing notes.  Pertinent labs & imaging results that were available during my care of the patient were reviewed by me and considered in my medical decision making (see chart for details).   32 year old male with history of TBI presents for 48-hour history of central aching chest pain.  Over the past 1 week he has had tremors in his right arm and pins and needle sensation.  Also reports headaches and heaviness in his legs.  Denies any new injuries.  Vitals are all normal and stable and he is overall well-appearing.  He has shaking in his right arm when lying down.  It stops when I performed the nose to finger testing.  No tremors at that time.  5 out of 5 strength upper and lower extremities.  Normal cranial nerve exam.  Chest clear to auscultation heart regular rate and rhythm.  EKG performed today shows normal sinus rhythm and regular rate.  Advised patient that he needs further workup in the emergency department likely to include  imaging of his head, cardiac enzymes and monitoring.  Explained to patient that I am unable to provide him with an answer as to why he is having the symptoms in the urgent care setting and he really should be evaluated in the ED especially given his history of TBI.  He is understanding and agreeable.  He presents by himself today.  He says he does feel comfortable taking himself to [facility] and will go there at this time.  I feel patient is safe to take himself based on his vitals and physical exam today.   Final Clinical Impressions(s) / UC Diagnoses   Final diagnoses:  Chest pain, unspecified type   Paresthesias     Discharge Instructions      You have been advised to follow up immediately in the emergency department for concerning signs.symptoms. If you declined EMS transport, please have a family member take you directly to the ED at this time. Do not delay. Based on concerns about condition, if you do not follow up in th e ED, you may risk poor outcomes including worsening of condition, delayed treatment and potentially life threatening issues. If you have declined to go to the ED at this time, you should call your PCP immediately to set up a follow up appointment.  Go to ED for red flag symptoms, including; fevers you cannot reduce with Tylenol/Motrin, severe headaches, vision changes, numbness/weakness in part of the body, lethargy, confusion, intractable vomiting, severe dehydration, chest pain, breathing difficulty, severe persistent abdominal or pelvic pain, signs of severe infection (increased redness, swelling of an area), feeling faint or passing out, dizziness, etc. You should especially go to the ED for sudden acute worsening of condition if you do not elect to go at this time.      ED Prescriptions   None    PDMP not reviewed this encounter.   Shirlee Latch, PA-C 08/24/23 [phone]

## 2023-08-23 NOTE — ED Triage Notes (Signed)
Pt states that approximately two days ago he began having midsternal non-radiating CP intermittently. Denies cardiac hx. Pt also reports paresthesias that started in his R hand and have now spread throughout his arms began approximately one week ago. No unilateral weakness noted during triage.

## 2023-08-23 NOTE — ED Provider Notes (Addendum)
[facility] Provider Note    Event Date/Time   First MD Initiated Contact with Patient 08/23/23 1628     (approximate)   History   Chest Pain   HPI  Jimmy Hale is a 32 y.o. male with history of TBI who comes in with concerns for chest pain.  Patient reports chest pain for the past 2 days and is constant.  He reports intermittent history of headaches since his TBI, migraines.  He states however his headaches have become more significant recently and he is noted some pins-and-needles sensation.  Initially he reported just tingling in the right arm with some tremoring but he states that now it seems to be in both arms.  He also reports that in both legs but not as severe.  He denies any tick bites, symptoms have been same pretty constant not worsening.  No travel [place].  He denies any new stressful events or anything that could have precipitated this.  He does report a prior history of alcohol use but states that he does not drink anymore except for occasionally.  States his last drink was over 3 weeks ago.   Physical Exam   Triage Vital Signs: ED Triage Vitals  Encounter Vitals Group     BP 08/23/23 1400 129/85     Systolic BP Percentile --      Diastolic BP Percentile --      Pulse Rate 08/23/23 1400 71     Resp 08/23/23 1400 17     Temp 08/23/23 1400 97.7 F (36.5 C)     Temp Source 08/23/23 1400 Oral     SpO2 08/23/23 1400 96 %     Weight 08/23/23 1401 210 lb (95.3 kg)     Height 08/23/23 1401 6\' 4"  (1.93 m)     Head Circumference --      Peak Flow --      Pain Score 08/23/23 1401 3     Pain Loc --      Pain Education --      Exclude from Growth Chart --     Most recent vital signs: Vitals:   08/23/23 1400  BP: 129/85  Pulse: 71  Resp: 17  Temp: 97.7 F (36.5 C)  SpO2: 96%     General: Awake, no distress.  CV:  Good peripheral perfusion.  Resp:  Normal effort.  Abd:  No distention.  Other:  Patient has slight tremor noted  of his bilateral arms and legs.  He is got equal grip strength and no pronator drift noted.  Equal leg strength.  Able to flex and extend the ankles.  Able to ambulate without any gait disturbances cranial nerves otherwise appear intact.  His abdomen is soft and nontender he is got good distal pulses throughout.   ED Results / Procedures / Treatments   Labs (all labs ordered are listed, but only abnormal results are displayed) Labs Reviewed  BASIC METABOLIC PANEL - Abnormal; Notable for the following components:      Result Value   Potassium 3.3 (*)    Glucose, Bld 144 (*)    Creatinine, Ser 1.30 (*)    All other components within normal limits  CBC  MAGNESIUM  TSH  T4, FREE  TROPONIN I (HIGH SENSITIVITY)  TROPONIN I (HIGH SENSITIVITY)     EKG  My interpretation of EKG:  Normal sinus rate of 77 that any ST elevation, T wave version lead III, normal intervals  RADIOLOGY I  have reviewed the CT head personally interpreted no evidence of intercranial hemorrhage  PROCEDURES:  Critical Care performed: No  Procedures   MEDICATIONS ORDERED IN ED: Medications  droperidol (INAPSINE) 2.5 MG/ML injection 2.5 mg (has no administration in time range)  sodium chloride 0.9 % bolus 1,000 mL (1,000 mLs Intravenous New Bag/Given 08/23/23 1804)  metoCLOPramide (REGLAN) injection 10 mg (10 mg Intravenous Given 08/23/23 1805)  diphenhydrAMINE (BENADRYL) injection 12.5 mg (12.5 mg Intravenous Given 08/23/23 1804)  potassium chloride SA (KLOR-CON M) CR tablet 40 mEq (40 mEq Oral Given 08/23/23 1812)     IMPRESSION / MDM / ASSESSMENT AND PLAN / ED COURSE  I reviewed the triage vital signs and the nursing notes.   Patient's presentation is most consistent with acute presentation with potential threat to life or bodily function.   Patient comes in with multiple symptoms but most notably tingling and tremors.  Is not on any medications that could be contributing to this.  [facility]  [facility], AKI.  CT imaging or evaluate for intracranial hemorrhage, cervical fracture given migraine cocktail in case this could be complex migraines.  Do not believe this represents a thoracic dissection given this tingling has been ongoing for a week he is good good distal pulses throughout  Thyroid is normal troponin is negative x 2 magnesium normal.  BMP shows stable creatinine.  Potassium slightly low will give some repletion.  CBC reassuring  Patient was asleep in bed and when I went to reevaluate him he states that his headache is slightly improved but he still has it.  He reports still having the same pins and needle sensation.  I will add on B12.  I will get CT imaging to rule out any type of dissection, aneurysm as well as MRI to any type of stroke or cervical pathology that could be contributing.Marland Kitchen  MRI was negative copies of report were given for some mild right foraminal narrowing.  Unclear what is causing patient's symptoms.  Do not feel like it is a seizure given he reports the tingling is all over and the shaking is not just in the right arm it is some mild tremors throughout.  My suspicion is this could be complex migraines.  I will give him neurology's number for follow-up B12 is still pending a can return to the ER for worsening symptoms or any other concerns  Is asleep in bed.  Updated on results.  Patient feels comfortable with discharge home.  Patient is driving we will monitor him to make sure he is safe to drive home.   Patient is on his side when a low blood pressure was taken prior to discharge and he was asleep therefore I asked the nurse to recheck this before discharge I do not feel like it is probably accurate.  The patient is on the cardiac monitor to evaluate for evidence of arrhythmia and/or significant heart rate changes.      FINAL CLINICAL IMPRESSION(S) / ED DIAGNOSES   Final diagnoses:  Tingling  Acute intractable headache, unspecified  headache type     Rx / DC Orders   ED Discharge Orders     None        Note:  This document was prepared using Dragon voice recognition software and may include unintentional dictation errors.   Concha Se, MD 08/23/23 2216    Concha Se, MD 08/23/23 2217

## 2023-08-23 NOTE — Discharge Instructions (Addendum)
  Please call neurology to make a follow-up appointment in case this could be related to complex migraines or your prior TBI.  He can return to the ER if develop worsening symptoms such as weakness or any other concerns   IMPRESSION: 1. No acute intracranial process. No evidence of acute or subacute infarct. 2. No spinal canal stenosis in the cervical spine. Mild right neural foraminal narrowing at C6-C7.

## 2023-08-24 LAB — VITAMIN B12: Vitamin B-12: 446 pg/mL (ref 180–914)
